# Patient Record
Sex: Male | Born: 1995
Health system: Southern US, Community
[De-identification: ages and names within clinical notes are randomized; demographics above are authoritative.]

## PROBLEM LIST (undated history)

## (undated) DIAGNOSIS — F909 Attention-deficit hyperactivity disorder, unspecified type: Secondary | ICD-10-CM

## (undated) DIAGNOSIS — L700 Acne vulgaris: Secondary | ICD-10-CM

## (undated) HISTORY — PX: HERNIA REPAIR: SHX51

## (undated) HISTORY — DX: Acne vulgaris: L70.0

## (undated) HISTORY — DX: Attention-deficit hyperactivity disorder, unspecified type: F90.9

---

## 2017-04-29 ENCOUNTER — Ambulatory Visit: Payer: Self-pay | Admitting: Infectious Diseases

## 2017-05-11 ENCOUNTER — Ambulatory Visit (INDEPENDENT_AMBULATORY_CARE_PROVIDER_SITE_OTHER): Payer: BLUE CROSS/BLUE SHIELD | Admitting: Infectious Diseases

## 2017-05-11 ENCOUNTER — Encounter: Payer: Self-pay | Admitting: Infectious Diseases

## 2017-05-11 VITALS — BP 112/75 | HR 70 | Temp 98.2°F | Ht 74.0 in | Wt 184.0 lb

## 2017-05-11 DIAGNOSIS — L700 Acne vulgaris: Secondary | ICD-10-CM | POA: Diagnosis not present

## 2017-05-11 DIAGNOSIS — L0390 Cellulitis, unspecified: Secondary | ICD-10-CM

## 2017-05-11 DIAGNOSIS — Z113 Encounter for screening for infections with a predominantly sexual mode of transmission: Secondary | ICD-10-CM

## 2017-05-11 DIAGNOSIS — B9562 Methicillin resistant Staphylococcus aureus infection as the cause of diseases classified elsewhere: Secondary | ICD-10-CM | POA: Diagnosis not present

## 2017-05-11 DIAGNOSIS — F909 Attention-deficit hyperactivity disorder, unspecified type: Secondary | ICD-10-CM

## 2017-05-11 MED ORDER — SULFAMETHOXAZOLE-TRIMETHOPRIM 800-160 MG PO TABS: 1.0000 | ORAL_TABLET | Freq: Two times a day (BID) | ORAL | 1 refills | Status: AC

## 2017-05-11 NOTE — Progress Notes (Signed)
   Subjective:    Patient ID: Tommy Austin, male    DOB: 17-Jul-1996, 21 y.o.   MRN: [id number]  HPI 21 yo M with hx of recurrent boils. He had I & D of area on his stomach June 2018 (MRSA) and a prev lesion on his waist line 11-2016 (MRSA). He also has a hx of acne, having prev been on accutane.  Has been given doxy. Had no trouble taking rx.  Is currently on doxy for last 10 days. Last boil was 2 weeks ago on R thigh. Is healing. Has ointment that he uses as well, honey based band aids.  Has used nasal anbx daily for since February.  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  Uses Tide detergent.  Works, wears clothes supplied by work.   The past medical history, family history and social history were reviewed/updated in EPIC  Review of Systems  Constitutional: Negative for appetite change, chills, fever and unexpected weight change.  Respiratory: Negative for cough and shortness of breath.   Gastrointestinal: Negative for constipation and diarrhea.  Genitourinary: Negative for difficulty urinating.  Skin: Positive for rash and wound.  Hematological: Negative for adenopathy.   No hx of childhood infections. Had ear infections as infant. No problem with teeth coming in.     Objective:   Physical Exam  Constitutional: He is oriented to person, place, and time. He appears well-developed and well-nourished.  HENT:  Mouth/Throat: No oropharyngeal exudate.  Eyes: Pupils are equal, round, and reactive to light. EOM are normal.  Neck: Normal range of motion. Neck supple.  Cardiovascular: Normal rate, regular rhythm and normal heart sounds.   Pulmonary/Chest: Effort normal and breath sounds normal.  Abdominal: Soft. Bowel sounds are normal. There is no tenderness. There is no rebound.  Musculoskeletal: Normal range of motion.  Lymphadenopathy:    He has no cervical adenopathy.       Right: No epitrochlear adenopathy present.       Left: No epitrochlear  adenopathy present.  Neurological: He is alert and oriented to person, place, and time.  Skin:     Psychiatric: He has a normal mood and affect.          Assessment & Plan:

## 2017-05-11 NOTE — Addendum Note (Signed)
Addended by: Keilyn Haggard C on: 05/11/2017 09:38 AM   Modules accepted: Orders

## 2017-05-11 NOTE — Assessment & Plan Note (Signed)
He appears to be doing everything correctly.  Will change his mupirocin to bid for 5 days of each month Wash clothes in hot water, do not wear more than daily.  Change anbx to bactrim for 30 days hibiclens washes weekly.  Use antibacterial soap (lever 2000 ect).  rtc in 6 weeks.

## 2017-05-12 LAB — HIV ANTIBODY (ROUTINE TESTING W REFLEX): HIV 1&2 Ab, 4th Generation: NONREACTIVE

## 2017-06-03 ENCOUNTER — Emergency Department (HOSPITAL_COMMUNITY)
Admission: EM | Admit: 2017-06-03 | Discharge: 2017-06-03 | Disposition: A | Discharge status: Home / Self Care | Payer: BLUE CROSS/BLUE SHIELD | Attending: Emergency Medicine | Admitting: Emergency Medicine

## 2017-06-03 ENCOUNTER — Emergency Department (HOSPITAL_COMMUNITY): Payer: BLUE CROSS/BLUE SHIELD

## 2017-06-03 ENCOUNTER — Encounter (HOSPITAL_COMMUNITY): Payer: Self-pay | Admitting: Emergency Medicine

## 2017-06-03 DIAGNOSIS — M791 Myalgia: Secondary | ICD-10-CM | POA: Diagnosis not present

## 2017-06-03 DIAGNOSIS — T148XXA Other injury of unspecified body region, initial encounter: Secondary | ICD-10-CM

## 2017-06-03 DIAGNOSIS — F909 Attention-deficit hyperactivity disorder, unspecified type: Secondary | ICD-10-CM | POA: Diagnosis not present

## 2017-06-03 DIAGNOSIS — Z79899 Other long term (current) drug therapy: Secondary | ICD-10-CM | POA: Diagnosis not present

## 2017-06-03 DIAGNOSIS — S82222A Displaced transverse fracture of shaft of left tibia, initial encounter for closed fracture: Secondary | ICD-10-CM | POA: Insufficient documentation

## 2017-06-03 DIAGNOSIS — Y998 Other external cause status: Secondary | ICD-10-CM | POA: Diagnosis not present

## 2017-06-03 DIAGNOSIS — S82422A Displaced transverse fracture of shaft of left fibula, initial encounter for closed fracture: Secondary | ICD-10-CM

## 2017-06-03 DIAGNOSIS — R911 Solitary pulmonary nodule: Secondary | ICD-10-CM | POA: Diagnosis not present

## 2017-06-03 DIAGNOSIS — Y92410 Unspecified street and highway as the place of occurrence of the external cause: Secondary | ICD-10-CM | POA: Diagnosis not present

## 2017-06-03 DIAGNOSIS — Y9389 Activity, other specified: Secondary | ICD-10-CM | POA: Diagnosis not present

## 2017-06-03 DIAGNOSIS — S8252XA Displaced fracture of medial malleolus of left tibia, initial encounter for closed fracture: Secondary | ICD-10-CM | POA: Diagnosis not present

## 2017-06-03 DIAGNOSIS — Z8781 Personal history of (healed) traumatic fracture: Secondary | ICD-10-CM

## 2017-06-03 DIAGNOSIS — R55 Syncope and collapse: Secondary | ICD-10-CM | POA: Insufficient documentation

## 2017-06-03 DIAGNOSIS — S60412A Abrasion of right middle finger, initial encounter: Secondary | ICD-10-CM | POA: Diagnosis not present

## 2017-06-03 DIAGNOSIS — S99912A Unspecified injury of left ankle, initial encounter: Secondary | ICD-10-CM | POA: Diagnosis present

## 2017-06-03 DIAGNOSIS — M7918 Myalgia, other site: Secondary | ICD-10-CM

## 2017-06-03 LAB — BASIC METABOLIC PANEL
Anion gap: 6 (ref 5–15)
BUN: 9 mg/dL (ref 6–20)
CALCIUM: 8.9 mg/dL (ref 8.9–10.3)
CHLORIDE: 103 mmol/L (ref 101–111)
CO2: 26 mmol/L (ref 22–32)
CREATININE: 0.92 mg/dL (ref 0.61–1.24)
GFR calc non Af Amer: >60 mL/min (ref >60)
GLUCOSE: 108 mg/dL — AB (ref 65–99)
Potassium: 3.9 mmol/L (ref 3.5–5.1)
Sodium: 135 mmol/L (ref 135–145)

## 2017-06-03 LAB — CBC
HEMATOCRIT: 42.8 % (ref 39.0–52.0)
HEMOGLOBIN: 14.6 g/dL (ref 13.0–17.0)
MCH: 30.5 pg (ref 26.0–34.0)
MCHC: 34.1 g/dL (ref 30.0–36.0)
MCV: 89.5 fL (ref 78.0–100.0)
Platelets: 229 10*3/uL (ref 150–400)
RBC: 4.78 MIL/uL (ref 4.22–5.81)
RDW: 12.9 % (ref 11.5–15.5)
WBC: 17.5 10*3/uL — ABNORMAL HIGH (ref 4.0–10.5)

## 2017-06-03 MED ORDER — IOPAMIDOL (ISOVUE-300) INJECTION 61%
INTRAVENOUS | Status: AC
  Administered 2017-06-03: 75 mL
  Filled 2017-06-03: qty 75

## 2017-06-03 MED ORDER — OXYCODONE-ACETAMINOPHEN 5-325 MG PO TABS: 1.0000 | ORAL_TABLET | ORAL | 0 refills | Status: DC | PRN

## 2017-06-03 MED ORDER — PROPOFOL 10 MG/ML IV BOLUS
INTRAVENOUS | Status: AC | PRN
  Administered 2017-06-03: 25 mg via INTRAVENOUS
  Administered 2017-06-03: 70 mg via INTRAVENOUS

## 2017-06-03 MED ORDER — METHOCARBAMOL 500 MG PO TABS
750.0000 mg | ORAL_TABLET | Freq: Once | ORAL | Status: AC
  Administered 2017-06-03: 750 mg via ORAL
  Filled 2017-06-03: qty 2

## 2017-06-03 MED ORDER — MORPHINE SULFATE (PF) 4 MG/ML IV SOLN
4.0000 mg | Freq: Once | INTRAVENOUS | Status: AC
  Administered 2017-06-03: 4 mg via INTRAVENOUS
  Filled 2017-06-03: qty 1

## 2017-06-03 MED ORDER — IBUPROFEN 600 MG PO TABS: 600.0000 mg | ORAL_TABLET | Freq: Four times a day (QID) | ORAL | 0 refills | Status: DC | PRN

## 2017-06-03 MED ORDER — PROPOFOL 10 MG/ML IV BOLUS
1.0000 mg/kg | Freq: Once | INTRAVENOUS | Status: AC
  Administered 2017-06-03: 86.2 mg via INTRAVENOUS
  Filled 2017-06-03: qty 20

## 2017-06-03 MED ORDER — METHOCARBAMOL 500 MG PO TABS: 500.0000 mg | ORAL_TABLET | Freq: Two times a day (BID) | ORAL | 0 refills | Status: DC

## 2017-06-03 MED ORDER — SODIUM CHLORIDE 0.9 % IV BOLUS (SEPSIS)
1000.0000 mL | Freq: Once | INTRAVENOUS | Status: AC
  Administered 2017-06-03: 1000 mL via INTRAVENOUS
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
## 2017-06-03 NOTE — ED Notes (Signed)
Pt still not back from CT waiting to draw labs

## 2017-06-03 NOTE — ED Notes (Signed)
Patient transported to C/xray
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
## 2017-06-03 NOTE — Progress Notes (Signed)
Orthopedic Tech Progress Note Patient Details:  Donata Dufficholas Gauer 1996/06/29 [id number]  Ortho Devices Type of Ortho Device: Ace wrap, Crutches, Post (short leg) splint, Stirrup splint Ortho Device/Splint Location: LLE Ortho Device/Splint Interventions: Ordered, Application, Adjustment   Jennye MoccasinHughes, Winston Sobczyk Craig 06/03/2017, 6:12 PM

## 2017-06-24 ENCOUNTER — Encounter: Payer: Self-pay | Admitting: Infectious Diseases

## 2017-06-24 ENCOUNTER — Ambulatory Visit (INDEPENDENT_AMBULATORY_CARE_PROVIDER_SITE_OTHER): Payer: BLUE CROSS/BLUE SHIELD | Admitting: Infectious Diseases

## 2017-06-24 VITALS — BP 124/76 | HR 94 | Temp 98.2°F | Wt 193.0 lb

## 2017-06-24 DIAGNOSIS — B9562 Methicillin resistant Staphylococcus aureus infection as the cause of diseases classified elsewhere: Secondary | ICD-10-CM

## 2017-06-24 DIAGNOSIS — Z23 Encounter for immunization: Secondary | ICD-10-CM

## 2017-06-24 DIAGNOSIS — L0390 Cellulitis, unspecified: Secondary | ICD-10-CM

## 2017-06-24 NOTE — Assessment & Plan Note (Signed)
He is doing well Will continue his regiemen of monthly mupirocin.   With regards to his surgery- I advised him to take a hibiclens bath tomorrow pm, to ask his surgeon to use vancomycin peri-operatively.  Will cc: his surgeon as well.   He will rtc in prn.
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
## 2017-06-24 NOTE — Addendum Note (Signed)
Addended by: Laurell Josephs on: 06/24/2017 12:16 PM   Modules accepted: Orders

## 2017-11-20 DIAGNOSIS — F4323 Adjustment disorder with mixed anxiety and depressed mood: Secondary | ICD-10-CM | POA: Diagnosis not present

## 2017-12-21 DIAGNOSIS — R112 Nausea with vomiting, unspecified: Secondary | ICD-10-CM | POA: Diagnosis not present

## 2018-01-05 DIAGNOSIS — F4323 Adjustment disorder with mixed anxiety and depressed mood: Secondary | ICD-10-CM | POA: Diagnosis not present

## 2018-03-16 DIAGNOSIS — Z8719 Personal history of other diseases of the digestive system: Secondary | ICD-10-CM | POA: Diagnosis not present

## 2018-03-16 DIAGNOSIS — Z9889 Other specified postprocedural states: Secondary | ICD-10-CM | POA: Diagnosis not present

## 2018-03-16 DIAGNOSIS — S81802A Unspecified open wound, left lower leg, initial encounter: Secondary | ICD-10-CM | POA: Diagnosis not present

## 2018-03-16 DIAGNOSIS — R1031 Right lower quadrant pain: Secondary | ICD-10-CM | POA: Diagnosis not present

## 2019-08-15 DIAGNOSIS — Z23 Encounter for immunization: Secondary | ICD-10-CM | POA: Diagnosis not present

## 2019-08-15 DIAGNOSIS — F909 Attention-deficit hyperactivity disorder, unspecified type: Secondary | ICD-10-CM | POA: Diagnosis not present

## 2019-08-15 DIAGNOSIS — Z8659 Personal history of other mental and behavioral disorders: Secondary | ICD-10-CM | POA: Diagnosis not present

## 2019-09-15 ENCOUNTER — Encounter (HOSPITAL_COMMUNITY): Payer: Self-pay | Admitting: Emergency Medicine

## 2019-09-15 ENCOUNTER — Other Ambulatory Visit: Payer: Self-pay

## 2019-09-15 ENCOUNTER — Ambulatory Visit (HOSPITAL_COMMUNITY)
Admission: EM | Admit: 2019-09-15 | Discharge: 2019-09-15 | Disposition: A | Discharge status: Home / Self Care | Payer: BC Managed Care – PPO | Attending: Family Medicine | Admitting: Family Medicine

## 2019-09-15 DIAGNOSIS — G43119 Migraine with aura, intractable, without status migrainosus: Secondary | ICD-10-CM | POA: Diagnosis not present

## 2019-09-15 DIAGNOSIS — R112 Nausea with vomiting, unspecified: Secondary | ICD-10-CM | POA: Diagnosis not present

## 2019-09-15 MED ORDER — METOCLOPRAMIDE HCL 5 MG/ML IJ SOLN
INTRAMUSCULAR | Status: AC
  Filled 2019-09-15: qty 2

## 2019-09-15 MED ORDER — METOCLOPRAMIDE HCL 5 MG/ML IJ SOLN
5.0000 mg | Freq: Once | INTRAMUSCULAR | Status: AC
Start: 2019-09-15 — End: 2019-09-15
  Administered 2019-09-15: 17:00:00 5 mg via INTRAMUSCULAR

## 2019-09-15 MED ORDER — DEXAMETHASONE SODIUM PHOSPHATE 10 MG/ML IJ SOLN
10.0000 mg | Freq: Once | INTRAMUSCULAR | Status: AC
Start: 2019-09-15 — End: 2019-09-15
  Administered 2019-09-15: 17:00:00 10 mg via INTRAMUSCULAR

## 2019-09-15 MED ORDER — ONDANSETRON 4 MG PO TBDP
ORAL_TABLET | ORAL | Status: AC
  Filled 2019-09-15: qty 1

## 2019-09-15 MED ORDER — ONDANSETRON 4 MG PO TBDP
4.0000 mg | ORAL_TABLET | Freq: Once | ORAL | Status: AC
  Administered 2019-09-15: 17:00:00 4 mg via ORAL

## 2019-09-15 MED ORDER — KETOROLAC TROMETHAMINE 60 MG/2ML IM SOLN
INTRAMUSCULAR | Status: AC
  Filled 2019-09-15: qty 2

## 2019-09-15 MED ORDER — DEXAMETHASONE SODIUM PHOSPHATE 10 MG/ML IJ SOLN
INTRAMUSCULAR | Status: AC
  Filled 2019-09-15: qty 1

## 2019-09-15 MED ORDER — KETOROLAC TROMETHAMINE 60 MG/2ML IM SOLN
60.0000 mg | Freq: Once | INTRAMUSCULAR | Status: AC
Start: 2019-09-15 — End: 2019-09-15
  Administered 2019-09-15: 17:00:00 60 mg via INTRAMUSCULAR

## 2019-09-15 MED ORDER — ONDANSETRON 4 MG PO TBDP: 4.0000 mg | ORAL_TABLET | Freq: Three times a day (TID) | ORAL | 0 refills | Status: AC | PRN

## 2019-09-15 NOTE — Discharge Instructions (Signed)
Meds ordered this encounter  Medications   ketorolac (TORADOL) injection 60 mg   metoCLOPramide (REGLAN) injection 5 mg   dexamethasone (DECADRON) injection 10 mg   ondansetron (ZOFRAN-ODT) disintegrating tablet 4 mg

## 2019-09-15 NOTE — ED Triage Notes (Signed)
Pt here for intermittent headaches onset 4 days associated w/n/v  Denies fevers, SOB, dyspnea  Hx of Migraines  A&O x4... NAD.Marland Kitchen. ambulatory

## 2019-09-15 NOTE — ED Provider Notes (Signed)
[place]   [id number] 09/15/19 Arrival Time: 0865  ASSESSMENT & PLAN:  1. Intractable migraine with aura without status migrainosus   2. Intractable vomiting with nausea, unspecified vomiting type      Meds ordered this encounter  Medications  . ketorolac (TORADOL) injection 60 mg  . metoCLOPramide (REGLAN) injection 5 mg  . dexamethasone (DECADRON) injection 10 mg  . ondansetron (ZOFRAN-ODT) disintegrating tablet 4 mg  . ondansetron (ZOFRAN-ODT) 4 MG disintegrating tablet    Sig: Take 1 tablet (4 mg total) by mouth every 8 (eight) hours as needed for nausea or vomiting.    Dispense:  15 tablet    Refill:  0   No signs of dehydration requiring IVF. Normal neurological exam. Afebrile without nuchal rigidity. Discussed. Current presentation and symptoms are consistent with prior migraines and are not consistent with SAH, ICH, meningitis, or temporal arteritis. Discussed.  Reports that he has an appt next week to see a neurologist. Comfortable with home observation.  Recommend: Follow-up Information    [facility].   Specialty: Emergency Medicine Why: If symptoms worsen in any way. Contact information: [address] [id number] [place] [phone]          Reviewed expectations re: course of current medical issues. Questions answered. Outlined signs and symptoms indicating need for more acute intervention. Patient verbalized understanding. After Visit Summary given.   SUBJECTIVE: History from: Patient. Patient is able to give a clear and coherent history.  Tommy Austin is a 23 y.o. male who presents with complaint of a migraine headache. Onset gradual, approx 4 d ago. Location: genrealized but started occiipitally. History of headaches: yes, with similar symptoms; usually not this prolonged though. Precipitating factors include: none which have been determined. Not the worst  headache of his life. Associated symptoms: Preceding aura: yes, spots. Nausea/vomiting: fairly persistent. Vision changes: no. Increased sensitivity to light and to noises: mild. Fever: no. No URI symptoms. Sinus pressure/congestion: no. Extremity weakness: no. Home treatment has included Tylenol with little improvement. Current headache has limited normal daily activities. Called out of work the past two days. Requests work note. Denies depression, dizziness, loss of balance, muscle weakness, numbness of extremities and speech difficulties. No head injury reported. Ambulatory without difficulty. No recent travel.  ROS: As per HPI. All other systems negative.    OBJECTIVE:  Vitals:   09/15/19 1658  BP: 125/69  Pulse: 60  Resp: 18  Temp: 98.3 F (36.8 C)  TempSrc: Oral  SpO2: 100%    General appearance: alert; NAD but appears fatigued HENT: normocephalic; atraumatic Eyes: PERRLA; EOMI; conjunctivae normal Neck: supple with FROM Lungs: speaks full sentences without difficulty; unlabored respirations Heart: regular rate Extremities: no edema; symmetrical with no gross deformities Skin: warm and dry Neurologic: alert; speech is fluent and clear without dysarthria or aphasia; CN 2-12 grossly intact; no facial droop; normal gait; normal symmetric reflexes; normal extremity strength and sensation throughout; bilateral upper and lower extremity sensation is grossly intact with 5/5 symmetric strength Psychological: alert and cooperative; normal mood and affect   No Known Allergies  Past Medical History:  Diagnosis Date  . ADHD   . Cystic acne    Social History   Socioeconomic History  . Marital status: Single    Spouse name: Not on file  . Number of children: Not on file  . Years of education: Not on file  . Highest education level: Not on file  Occupational History  .  Not on file  Tobacco Use  . Smoking status: Never Smoker  . Smokeless tobacco: Never Used    Substance and Sexual Activity  . Alcohol use: No  . Drug use: No  . Sexual activity: Yes    Partners: Female  Other Topics Concern  . Not on file  Social History Narrative  . Not on file   Social Determinants of Health   Financial Resource Strain:   . Difficulty of Paying Living Expenses: Not on file  Food Insecurity:   . Worried About Programme researcher, broadcasting/film/video in the Last Year: Not on file  . Ran Out of Food in the Last Year: Not on file  Transportation Needs:   . Lack of Transportation (Medical): Not on file  . Lack of Transportation (Non-Medical): Not on file  Physical Activity:   . Days of Exercise per Week: Not on file  . Minutes of Exercise per Session: Not on file  Stress:   . Feeling of Stress : Not on file  Social Connections:   . Frequency of Communication with Friends and Family: Not on file  . Frequency of Social Gatherings with Friends and Family: Not on file  . Attends Religious Services: Not on file  . Active Member of Clubs or Organizations: Not on file  . Attends Banker Meetings: Not on file  . Marital Status: Not on file  Intimate Partner Violence:   . Fear of Current or Ex-Partner: Not on file  . Emotionally Abused: Not on file  . Physically Abused: Not on file  . Sexually Abused: Not on file   Family History  Problem Relation Age of Onset  . Acne Mother    Past Surgical History:  Procedure Laterality Date  . HERNIA REPAIR       Mardella Layman, MD 09/15/19 [phone]

## 2019-09-19 DIAGNOSIS — G43109 Migraine with aura, not intractable, without status migrainosus: Secondary | ICD-10-CM | POA: Diagnosis not present

## 2019-09-21 ENCOUNTER — Encounter (HOSPITAL_COMMUNITY): Payer: Self-pay | Admitting: Emergency Medicine

## 2019-09-21 ENCOUNTER — Emergency Department (HOSPITAL_COMMUNITY)
Admission: EM | Admit: 2019-09-21 | Discharge: 2019-09-21 | Disposition: A | Discharge status: Home / Self Care | Payer: BC Managed Care – PPO | Source: Home / Self Care | Attending: Emergency Medicine | Admitting: Emergency Medicine

## 2019-09-21 ENCOUNTER — Other Ambulatory Visit: Payer: Self-pay

## 2019-09-21 DIAGNOSIS — R519 Headache, unspecified: Secondary | ICD-10-CM

## 2019-09-21 DIAGNOSIS — Z79899 Other long term (current) drug therapy: Secondary | ICD-10-CM | POA: Insufficient documentation

## 2019-09-21 DIAGNOSIS — G43809 Other migraine, not intractable, without status migrainosus: Secondary | ICD-10-CM

## 2019-09-21 DIAGNOSIS — R112 Nausea with vomiting, unspecified: Secondary | ICD-10-CM | POA: Insufficient documentation

## 2019-09-21 MED ORDER — PROCHLORPERAZINE EDISYLATE 10 MG/2ML IJ SOLN
10.0000 mg | Freq: Once | INTRAMUSCULAR | Status: AC
  Administered 2019-09-21: 10:00:00 10 mg via INTRAVENOUS
  Filled 2019-09-21: qty 2

## 2019-09-21 MED ORDER — SODIUM CHLORIDE 0.9 % IV BOLUS
1000.0000 mL | Freq: Once | INTRAVENOUS | Status: AC
  Administered 2019-09-21: 10:00:00 1000 mL via INTRAVENOUS

## 2019-09-21 MED ORDER — DIPHENHYDRAMINE HCL 50 MG/ML IJ SOLN
25.0000 mg | Freq: Once | INTRAMUSCULAR | Status: AC
  Administered 2019-09-21: 10:00:00 25 mg via INTRAVENOUS
  Filled 2019-09-21: qty 1

## 2019-09-21 MED ORDER — KETOROLAC TROMETHAMINE 30 MG/ML IJ SOLN
30.0000 mg | Freq: Once | INTRAMUSCULAR | Status: AC
  Administered 2019-09-21: 10:00:00 30 mg via INTRAVENOUS
  Filled 2019-09-21: qty 1

## 2019-09-21 MED ORDER — DEXAMETHASONE SODIUM PHOSPHATE 10 MG/ML IJ SOLN
10.0000 mg | Freq: Once | INTRAMUSCULAR | Status: AC
  Administered 2019-09-21: 10:00:00 10 mg via INTRAVENOUS
  Filled 2019-09-21: qty 1

## 2019-09-21 MED ORDER — PROCHLORPERAZINE MALEATE 10 MG PO TABS: 10.0000 mg | ORAL_TABLET | Freq: Two times a day (BID) | ORAL | 0 refills | Status: AC | PRN

## 2019-09-21 NOTE — ED Triage Notes (Signed)
Pt reports this is day 10 of migraine and having n/v. Reports migraine meds was prescribed recently aren't helping.

## 2019-09-21 NOTE — Discharge Instructions (Signed)
Your history and exam today are consistent with a bad migraine headache.  After our shared decision made conversation, we agreed to provide a headache cocktail through your IV and see if this makes feel better.  Given your complete resolution of headache and feeling much better, we feel you are safe for discharge home and not have to do imaging, blood work, or lumbar puncture as we discussed.  Please follow-up with your primary doctor and stay hydrated.  Please use the nausea medicine/headache medicine to help.  If any symptoms change or worsen, please return to the nearest emergency department.

## 2019-09-21 NOTE — ED Notes (Signed)
Patient verbalized understanding of discharge instructions and questions answered.

## 2019-09-21 NOTE — ED Provider Notes (Signed)
[place] DEPT Provider Note   CSN: [id number] Arrival date & time: 09/21/19  4174     History Chief Complaint  Patient presents with  . Migraine  . Emesis    Tommy Austin is a 23 y.o. male.  The history is provided by the patient and medical records.  Migraine This is a recurrent problem. The current episode started more than 1 week ago. The problem occurs constantly. The problem has not changed since onset.Associated symptoms include headaches. Pertinent negatives include no chest pain, no abdominal pain and no shortness of breath. Nothing aggravates the symptoms. Nothing relieves the symptoms. He has tried nothing for the symptoms. The treatment provided no relief.  Emesis Associated symptoms: headaches   Associated symptoms: no abdominal pain, no chills, no diarrhea and no fever        Past Medical History:  Diagnosis Date  . ADHD   . Cystic acne     Patient Active Problem List   Diagnosis Date Noted  . ADHD 05/11/2017  . Cystic acne 05/11/2017  . MRSA cellulitis 05/11/2017    Past Surgical History:  Procedure Laterality Date  . HERNIA REPAIR         Family History  Problem Relation Age of Onset  . Acne Mother     Social History   Tobacco Use  . Smoking status: Never Smoker  . Smokeless tobacco: Never Used  Substance Use Topics  . Alcohol use: No  . Drug use: No    Home Medications Prior to Admission medications   Medication Sig Start Date End Date Taking? Authorizing Provider  cholecalciferol (VITAMIN D) 1000 units tablet Take 1,000 Units by mouth daily.    [provider]  ibuprofen (ADVIL,MOTRIN) 600 MG tablet Take 1 tablet (600 mg total) by mouth every 6 (six) hours as needed. Patient not taking: Reported on 06/24/2017 06/03/17   Shary Decamp, PA-C  methocarbamol (ROBAXIN) 500 MG tablet Take 1 tablet (500 mg total) by mouth 2 (two) times daily. Patient not taking: Reported on 06/24/2017 06/03/17    Shary Decamp, PA-C  Methylphenidate HCl ER 54 MG TB24 Take 72 mg by mouth.    [provider]  mupirocin ointment (BACTROBAN) 2 % APPLY TO AFFECTED AREA daily for 7 days every month 03/18/17   [provider]  ondansetron (ZOFRAN-ODT) 4 MG disintegrating tablet Take 1 tablet (4 mg total) by mouth every 8 (eight) hours as needed for nausea or vomiting. 09/15/19   Vanessa Kick, MD  oxyCODONE-acetaminophen (PERCOCET/ROXICET) 5-325 MG tablet Take 1 tablet by mouth every 4 (four) hours as needed for severe pain. Patient not taking: Reported on 06/24/2017 06/03/17   Shary Decamp, PA-C  PARoxetine (PAXIL) 10 MG tablet take 1 tablet by mouth daily 04/27/17   [provider]  Probiotic Product (ALIGN PO) Take 1 capsule by mouth daily.    [provider]  vitamin B-12 (CYANOCOBALAMIN) 1000 MCG tablet Take 1,000 mcg by mouth daily.    [provider]    Allergies    Patient has no known allergies.  Review of Systems   Review of Systems  Constitutional: Negative for chills, diaphoresis, fatigue and fever.  HENT: Negative for congestion.   Eyes: Positive for photophobia. Negative for visual disturbance.  Respiratory: Negative for chest tightness, shortness of breath and wheezing.   Cardiovascular: Negative for chest pain.  Gastrointestinal: Positive for nausea and vomiting. Negative for abdominal pain, constipation and diarrhea.  Genitourinary: Negative for flank pain and  frequency.  Musculoskeletal: Negative for back pain, neck pain and neck stiffness.  Skin: Negative for rash and wound.  Neurological: Positive for headaches. Negative for dizziness and light-headedness.  Psychiatric/Behavioral: Negative for agitation and confusion.    Physical Exam Updated Vital Signs BP 126/78   Pulse (!) 58   Temp 98 F (36.7 C) (Oral)   Resp 13   SpO2 99%   Physical Exam Vitals and nursing note reviewed.  Constitutional:      General: He is not in acute  distress.    Appearance: He is well-developed. He is not ill-appearing, toxic-appearing or diaphoretic.  HENT:     Head: Normocephalic and atraumatic.     Nose: Nose normal. No congestion or rhinorrhea.     Mouth/Throat:     Mouth: Mucous membranes are moist.     Pharynx: No oropharyngeal exudate or posterior oropharyngeal erythema.  Eyes:     Extraocular Movements: Extraocular movements intact.     Conjunctiva/sclera: Conjunctivae normal.     Pupils: Pupils are equal, round, and reactive to light.  Cardiovascular:     Rate and Rhythm: Normal rate and regular rhythm.     Heart sounds: No murmur.  Pulmonary:     Effort: Pulmonary effort is normal. No respiratory distress.     Breath sounds: Normal breath sounds. No wheezing, rhonchi or rales.  Chest:     Chest wall: No tenderness.  Abdominal:     Palpations: Abdomen is soft.     Tenderness: There is no abdominal tenderness. There is no right CVA tenderness, left CVA tenderness, guarding or rebound.  Musculoskeletal:        General: No tenderness.     Cervical back: Neck supple. No tenderness.  Skin:    General: Skin is warm and dry.     Capillary Refill: Capillary refill takes less than 2 seconds.     Findings: No erythema.  Neurological:     General: No focal deficit present.     Mental Status: He is alert.  Psychiatric:        Mood and Affect: Mood normal.     ED Results / Procedures / Treatments   Labs (all labs ordered are listed, but only abnormal results are displayed) Labs Reviewed - No data to display  EKG None  Radiology No results found.  Procedures Procedures (including critical care time)  Medications Ordered in ED Medications  prochlorperazine (COMPAZINE) injection 10 mg (10 mg Intravenous Given 09/21/19 0957)  diphenhydrAMINE (BENADRYL) injection 25 mg (25 mg Intravenous Given 09/21/19 0957)  dexamethasone (DECADRON) injection 10 mg (10 mg Intravenous Given 09/21/19 0956)  ketorolac (TORADOL) 30  MG/ML injection 30 mg (30 mg Intravenous Given 09/21/19 0956)  sodium chloride 0.9 % bolus 1,000 mL (0 mLs Intravenous Stopped 09/21/19 1149)    ED Course  I have reviewed the triage vital signs and the nursing notes.  Pertinent labs & imaging results that were available during my care of the patient were reviewed by me and considered in my medical decision making (see chart for details).    MDM Rules/Calculators/A&P                      Tommy Austin is a 23 y.o. male with a past medical history significant for ADHD and prior migraines who presents with headache.  He reports that for the last week and a half he has had significant headaches consistent with prior migraines.  He reports nausea, vomiting, auras,  and severe headache.  He is sitting in the dark with nausea and vomiting when I examined him.  He denies recent fevers, chills, neck pain or neck stiffness.  He denies any trauma.  He reports it feels similar but worse than prior.  No neurologic complaints otherwise.  No new medication use.  He denies any chest pain, shortness of breath, palpitations, constipation, diarrhea.  On exam, patient moving all extremities.  Patient is actively vomiting.  Patient has normal sensation in all extremities.  Lungs clear chest nontender abdomen nontender.  Normal neck range of motion.  Pupils symmetric.  Neck is nontender.  Clinically suspect this is a migraine type headache.  We discussed giving a headache cocktail initially and if symptoms not improve, we could consider imaging or other work-up.  Patient is agreeable with this plan.  He will be given a cocktail and reassess.  Anticipate discharge if headache improves.     2:08 PM Patient reports headache is completely resolved.  It is now a 0 out of 10.  Patient feels he is ready go home.  Patient will given prescription for Compazine to help with other nausea and headache.  He was instructed to use Benadryl if he gets some of the side effects.   Patient will stay hydrated and rest.  Patient agreed with plan of care and not pursuing lumbar puncture, imaging, or labs.  Patient had no depressions or concerns and was discharged in good condition with resolution of headache.    Final Clinical Impression(s) / ED Diagnoses Final diagnoses:  Other migraine without status migrainosus, not intractable  Bad headache    Rx / DC Orders ED Discharge Orders         Ordered    prochlorperazine (COMPAZINE) 10 MG tablet  2 times daily PRN     09/21/19 1407          Clinical Impression: 1. Other migraine without status migrainosus, not intractable   2. Bad headache     Disposition: Discharge  Condition: Good  I have discussed the results, Dx and Tx plan with the pt(& family if present). He/she/they expressed understanding and agree(s) with the plan. Discharge instructions discussed at great length. Strict return precautions discussed and pt &/or family have verbalized understanding of the instructions. No further questions at time of discharge.    New Prescriptions   PROCHLORPERAZINE (COMPAZINE) 10 MG TABLET    Take 1 tablet (10 mg total) by mouth 2 (two) times daily as needed for nausea or vomiting.    Follow Up: Renford DillsPolite, Ronald, MD [address] [place] [id number] [phone]     [facility] [address] [id number] [address] [id number] [phone]       Rika Daughdrill, Canary Brimhristopher J, MD 09/21/19 1409

## 2019-09-22 ENCOUNTER — Inpatient Hospital Stay (HOSPITAL_COMMUNITY)
Admission: EM | Admit: 2019-09-22 | Discharge: 2019-10-16 | DRG: 025 | Disposition: A | Discharge status: Home / Self Care | Payer: BC Managed Care – PPO | Attending: Neurological Surgery | Admitting: Neurological Surgery

## 2019-09-22 ENCOUNTER — Emergency Department (HOSPITAL_COMMUNITY): Payer: BC Managed Care – PPO

## 2019-09-22 ENCOUNTER — Encounter (HOSPITAL_COMMUNITY): Payer: Self-pay

## 2019-09-22 ENCOUNTER — Other Ambulatory Visit: Payer: Self-pay

## 2019-09-22 DIAGNOSIS — G0490 Encephalitis and encephalomyelitis, unspecified: Secondary | ICD-10-CM | POA: Diagnosis not present

## 2019-09-22 DIAGNOSIS — B370 Candidal stomatitis: Secondary | ICD-10-CM | POA: Diagnosis present

## 2019-09-22 DIAGNOSIS — G919 Hydrocephalus, unspecified: Secondary | ICD-10-CM | POA: Diagnosis not present

## 2019-09-22 DIAGNOSIS — G930 Cerebral cysts: Secondary | ICD-10-CM | POA: Diagnosis not present

## 2019-09-22 DIAGNOSIS — D496 Neoplasm of unspecified behavior of brain: Secondary | ICD-10-CM | POA: Diagnosis not present

## 2019-09-22 DIAGNOSIS — Q046 Congenital cerebral cysts: Secondary | ICD-10-CM | POA: Diagnosis not present

## 2019-09-22 DIAGNOSIS — G43809 Other migraine, not intractable, without status migrainosus: Secondary | ICD-10-CM | POA: Diagnosis present

## 2019-09-22 DIAGNOSIS — G911 Obstructive hydrocephalus: Secondary | ICD-10-CM | POA: Diagnosis not present

## 2019-09-22 DIAGNOSIS — R55 Syncope and collapse: Secondary | ICD-10-CM | POA: Diagnosis not present

## 2019-09-22 DIAGNOSIS — R519 Headache, unspecified: Secondary | ICD-10-CM

## 2019-09-22 DIAGNOSIS — R112 Nausea with vomiting, unspecified: Secondary | ICD-10-CM | POA: Diagnosis present

## 2019-09-22 DIAGNOSIS — Z20822 Contact with and (suspected) exposure to covid-19: Secondary | ICD-10-CM | POA: Diagnosis present

## 2019-09-22 DIAGNOSIS — R4182 Altered mental status, unspecified: Secondary | ICD-10-CM | POA: Diagnosis not present

## 2019-09-22 DIAGNOSIS — I615 Nontraumatic intracerebral hemorrhage, intraventricular: Secondary | ICD-10-CM | POA: Diagnosis present

## 2019-09-22 DIAGNOSIS — E871 Hypo-osmolality and hyponatremia: Secondary | ICD-10-CM | POA: Diagnosis present

## 2019-09-22 DIAGNOSIS — I619 Nontraumatic intracerebral hemorrhage, unspecified: Secondary | ICD-10-CM | POA: Diagnosis not present

## 2019-09-22 DIAGNOSIS — Z79899 Other long term (current) drug therapy: Secondary | ICD-10-CM | POA: Diagnosis not present

## 2019-09-22 DIAGNOSIS — R110 Nausea: Secondary | ICD-10-CM | POA: Diagnosis not present

## 2019-09-22 DIAGNOSIS — R52 Pain, unspecified: Secondary | ICD-10-CM | POA: Diagnosis not present

## 2019-09-22 DIAGNOSIS — Z9889 Other specified postprocedural states: Secondary | ICD-10-CM | POA: Diagnosis not present

## 2019-09-22 DIAGNOSIS — Z20828 Contact with and (suspected) exposure to other viral communicable diseases: Secondary | ICD-10-CM | POA: Diagnosis not present

## 2019-09-22 LAB — BASIC METABOLIC PANEL
Anion gap: 11 (ref 5–15)
BUN: 12 mg/dL (ref 6–20)
CO2: 25 mmol/L (ref 22–32)
Calcium: 9.1 mg/dL (ref 8.9–10.3)
Chloride: 104 mmol/L (ref 98–111)
Creatinine, Ser: 0.86 mg/dL (ref 0.61–1.24)
GFR calc Af Amer: >60 mL/min (ref >60)
GFR calc non Af Amer: >60 mL/min (ref >60)
Glucose, Bld: 108 mg/dL — ABNORMAL HIGH (ref 70–99)
Potassium: 3.4 mmol/L — ABNORMAL LOW (ref 3.5–5.1)
Sodium: 140 mmol/L (ref 135–145)

## 2019-09-22 LAB — ETHANOL: Alcohol, Ethyl (B): <10 mg/dL (ref <10)

## 2019-09-22 LAB — CBC WITH DIFFERENTIAL/PLATELET
Abs Immature Granulocytes: 0.08 10*3/uL — ABNORMAL HIGH (ref 0.00–0.07)
Basophils Absolute: 0 10*3/uL (ref 0.0–0.1)
Basophils Relative: 0 %
Eosinophils Absolute: 0 10*3/uL (ref 0.0–0.5)
Eosinophils Relative: 0 %
HCT: 43.2 % (ref 39.0–52.0)
Hemoglobin: 14.7 g/dL (ref 13.0–17.0)
Immature Granulocytes: 1 %
Lymphocytes Relative: 11 %
Lymphs Abs: 1.8 10*3/uL (ref 0.7–4.0)
MCH: 30.7 pg (ref 26.0–34.0)
MCHC: 34 g/dL (ref 30.0–36.0)
MCV: 90.2 fL (ref 80.0–100.0)
Monocytes Absolute: 1.3 10*3/uL — ABNORMAL HIGH (ref 0.1–1.0)
Monocytes Relative: 8 %
Neutro Abs: 13.4 10*3/uL — ABNORMAL HIGH (ref 1.7–7.7)
Neutrophils Relative %: 80 %
Platelets: 245 10*3/uL (ref 150–400)
RBC: 4.79 MIL/uL (ref 4.22–5.81)
RDW: 11.7 % (ref 11.5–15.5)
WBC: 16.6 10*3/uL — ABNORMAL HIGH (ref 4.0–10.5)
nRBC: 0 % (ref 0.0–0.2)

## 2019-09-22 LAB — RAPID URINE DRUG SCREEN, HOSP PERFORMED
Amphetamines: NOT DETECTED
Barbiturates: NOT DETECTED
Benzodiazepines: NOT DETECTED
Cocaine: NOT DETECTED
Opiates: NOT DETECTED
Tetrahydrocannabinol: POSITIVE — AB

## 2019-09-22 LAB — RESPIRATORY PANEL BY RT PCR (FLU A&B, COVID)
Influenza A by PCR: NEGATIVE
Influenza B by PCR: NEGATIVE
SARS Coronavirus 2 by RT PCR: NEGATIVE

## 2019-09-22 LAB — CBG MONITORING, ED: Glucose-Capillary: 135 mg/dL — ABNORMAL HIGH (ref 70–99)

## 2019-09-22 MED ORDER — DEXAMETHASONE SODIUM PHOSPHATE 4 MG/ML IJ SOLN
4.0000 mg | Freq: Four times a day (QID) | INTRAMUSCULAR | Status: DC
  Administered 2019-09-23 (×3): 4 mg via INTRAVENOUS
  Filled 2019-09-22 (×3): qty 1

## 2019-09-22 MED ORDER — KETOROLAC TROMETHAMINE 30 MG/ML IJ SOLN
30.0000 mg | Freq: Once | INTRAMUSCULAR | Status: AC
  Administered 2019-09-22: 30 mg via INTRAVENOUS
  Filled 2019-09-22: qty 1

## 2019-09-22 MED ORDER — ACETAMINOPHEN 650 MG RE SUPP: 650.0000 mg | Freq: Four times a day (QID) | RECTAL | Status: DC | PRN

## 2019-09-22 MED ORDER — METOCLOPRAMIDE HCL 5 MG/ML IJ SOLN
10.0000 mg | Freq: Once | INTRAMUSCULAR | Status: AC
  Administered 2019-09-22: 10 mg via INTRAVENOUS
  Filled 2019-09-22: qty 2

## 2019-09-22 MED ORDER — HYDROMORPHONE HCL 1 MG/ML IJ SOLN
0.5000 mg | Freq: Once | INTRAMUSCULAR | Status: AC
  Administered 2019-09-22: 0.5 mg via INTRAVENOUS
  Filled 2019-09-22: qty 1

## 2019-09-22 MED ORDER — ACETAMINOPHEN 325 MG PO TABS
650.0000 mg | ORAL_TABLET | Freq: Four times a day (QID) | ORAL | Status: DC | PRN
  Administered 2019-09-24 – 2019-10-14 (×12): 650 mg via ORAL
  Filled 2019-09-22 (×12): qty 2

## 2019-09-22 MED ORDER — SODIUM CHLORIDE 0.9 % IV BOLUS
1000.0000 mL | Freq: Once | INTRAVENOUS | Status: AC
  Administered 2019-09-22: 1000 mL via INTRAVENOUS

## 2019-09-22 MED ORDER — LEVETIRACETAM IN NACL 500 MG/100ML IV SOLN
500.0000 mg | Freq: Once | INTRAVENOUS | Status: AC
  Administered 2019-09-22: 500 mg via INTRAVENOUS
  Filled 2019-09-22: qty 100

## 2019-09-22 MED ORDER — GADOBUTROL 1 MMOL/ML IV SOLN
7.5000 mL | Freq: Once | INTRAVENOUS | Status: AC | PRN
  Administered 2019-09-22: 7.5 mL via INTRAVENOUS

## 2019-09-22 MED ORDER — DIPHENHYDRAMINE HCL 50 MG/ML IJ SOLN
25.0000 mg | Freq: Once | INTRAMUSCULAR | Status: AC
  Administered 2019-09-22: 25 mg via INTRAVENOUS
  Filled 2019-09-22: qty 1

## 2019-09-22 MED ORDER — ONDANSETRON HCL 4 MG/2ML IJ SOLN
4.0000 mg | Freq: Four times a day (QID) | INTRAMUSCULAR | Status: DC | PRN
  Administered 2019-09-23 – 2019-10-13 (×26): 4 mg via INTRAVENOUS
  Filled 2019-09-22 (×27): qty 2

## 2019-09-22 MED ORDER — HYDROCODONE-ACETAMINOPHEN 5-325 MG PO TABS
1.0000 | ORAL_TABLET | ORAL | Status: DC | PRN
  Administered 2019-09-22: 2 via ORAL
  Administered 2019-09-23 – 2019-09-24 (×4): 1 via ORAL
  Administered 2019-09-25 – 2019-09-27 (×5): 2 via ORAL
  Administered 2019-09-27: 1 via ORAL
  Administered 2019-09-27: 2 via ORAL
  Administered 2019-09-27 – 2019-09-28 (×5): 1 via ORAL
  Administered 2019-09-28: 2 via ORAL
  Administered 2019-09-29 (×3): 1 via ORAL
  Administered 2019-10-02 – 2019-10-04 (×6): 2 via ORAL
  Administered 2019-10-04: 1 via ORAL
  Administered 2019-10-05: 2 via ORAL
  Administered 2019-10-05: 1 via ORAL
  Administered 2019-10-05 (×3): 2 via ORAL
  Administered 2019-10-06: 1 via ORAL
  Administered 2019-10-06: 2 via ORAL
  Administered 2019-10-06: 1 via ORAL
  Administered 2019-10-07 – 2019-10-10 (×9): 2 via ORAL
  Administered 2019-10-11: 1 via ORAL
  Administered 2019-10-11 – 2019-10-12 (×7): 2 via ORAL
  Administered 2019-10-13: 1 via ORAL
  Administered 2019-10-13 – 2019-10-16 (×6): 2 via ORAL
  Filled 2019-09-22 (×2): qty 1
  Filled 2019-09-22 (×10): qty 2
  Filled 2019-09-22: qty 1
  Filled 2019-09-22 (×3): qty 2
  Filled 2019-09-22: qty 1
  Filled 2019-09-22 (×6): qty 2
  Filled 2019-09-22: qty 1
  Filled 2019-09-22: qty 2
  Filled 2019-09-22: qty 1
  Filled 2019-09-22: qty 2
  Filled 2019-09-22 (×2): qty 1
  Filled 2019-09-22 (×6): qty 2
  Filled 2019-09-22: qty 1
  Filled 2019-09-22 (×3): qty 2
  Filled 2019-09-22: qty 1
  Filled 2019-09-22 (×3): qty 2
  Filled 2019-09-22: qty 1
  Filled 2019-09-22 (×2): qty 2
  Filled 2019-09-22: qty 1
  Filled 2019-09-22: qty 2
  Filled 2019-09-22: qty 1
  Filled 2019-09-22: qty 2
  Filled 2019-09-22: qty 1
  Filled 2019-09-22 (×4): qty 2
  Filled 2019-09-22 (×4): qty 1
  Filled 2019-09-22 (×3): qty 2
  Filled 2019-09-22: qty 1
  Filled 2019-09-22: qty 2
  Filled 2019-09-22: qty 1
  Filled 2019-09-22: qty 2

## 2019-09-22 MED ORDER — FENTANYL CITRATE (PF) 100 MCG/2ML IJ SOLN
50.0000 ug | Freq: Once | INTRAMUSCULAR | Status: AC
  Administered 2019-09-22: 50 ug via INTRAVENOUS
  Filled 2019-09-22: qty 2

## 2019-09-22 MED ORDER — ONDANSETRON HCL 4 MG PO TABS
4.0000 mg | ORAL_TABLET | Freq: Four times a day (QID) | ORAL | Status: DC | PRN
  Administered 2019-10-09 – 2019-10-11 (×2): 4 mg via ORAL
  Filled 2019-09-22 (×4): qty 1

## 2019-09-22 MED ORDER — LEVETIRACETAM IN NACL 500 MG/100ML IV SOLN
500.0000 mg | Freq: Two times a day (BID) | INTRAVENOUS | Status: DC
  Administered 2019-09-22 – 2019-09-26 (×8): 500 mg via INTRAVENOUS
  Filled 2019-09-22 (×8): qty 100

## 2019-09-22 NOTE — ED Triage Notes (Addendum)
EMS reports from home, Pt c/o migraines x 3 weeks. Mother states in process of seeing neurologist.Increasing pain and nausea since 2am this morning.  BP 136/74 HR 60 RR 16 Sp02 100 RA CBG 159 Temp 97.9  20 LAC 4mg  Zofran

## 2019-09-22 NOTE — ED Notes (Signed)
Urinal at bedside. Pt has been made aware that MD needs UA sample. 

## 2019-09-22 NOTE — ED Notes (Signed)
Patient transported to MRI 

## 2019-09-22 NOTE — ED Provider Notes (Addendum)
[facility]-EMERGENCY DEPT Provider Note   CSN: [id number] Arrival date & time: 09/22/19  0809     History Chief Complaint  Patient presents with  . Migraine    Alexsis Branscom is a 23 y.o. male.  Frontal headache for 7 days with associated nausea.  This is happened before.  No neuro deficits, stiff neck, facial asymmetry, motor or sensory weakness.  He has tried Imitrex without relief.  Severity is moderate.  1115: Further history from mother.  She reports her son had a tonic-clonic seizure this morning approximately 0700.  He was confused for a short time after this event.  No previous history of seizures.  No alcohol or street drugs in the past 2 weeks.        Past Medical History:  Diagnosis Date  . ADHD   . Cystic acne     Patient Active Problem List   Diagnosis Date Noted  . ADHD 05/11/2017  . Cystic acne 05/11/2017  . MRSA cellulitis 05/11/2017    Past Surgical History:  Procedure Laterality Date  . HERNIA REPAIR         Family History  Problem Relation Age of Onset  . Acne Mother     Social History   Tobacco Use  . Smoking status: Never Smoker  . Smokeless tobacco: Never Used  Substance Use Topics  . Alcohol use: No  . Drug use: No    Home Medications Prior to Admission medications   Medication Sig Start Date End Date Taking? Authorizing Provider  Ibuprofen-Acetaminophen (ADVIL DUAL ACTION) 125-250 MG TABS Take 1 tablet by mouth every 6 (six) hours as needed (pain).   Yes [provider]  ondansetron (ZOFRAN-ODT) 4 MG disintegrating tablet Take 1 tablet (4 mg total) by mouth every 8 (eight) hours as needed for nausea or vomiting. 09/15/19  Yes Mardella Layman, MD  prochlorperazine (COMPAZINE) 10 MG tablet Take 1 tablet (10 mg total) by mouth 2 (two) times daily as needed for nausea or vomiting. 09/21/19  Yes Tegeler, Canary Brim, MD  SUMAtriptan (IMITREX) 100 MG tablet Take 100 mg by mouth every 2 (two) hours as  needed for migraine. Do not exceed 200 mg in 24 hours 09/19/19  Yes [provider]  traMADol (ULTRAM) 50 MG tablet Take 50 mg by mouth every 6 (six) hours as needed for moderate pain.   Yes [provider]  propranolol (INDERAL) 40 MG tablet Take 40 mg by mouth 2 (two) times daily. 09/19/19   [provider]    Allergies    Patient has no known allergies.  Review of Systems   Review of Systems  All other systems reviewed and are negative.   Physical Exam Updated Vital Signs BP 119/69   Pulse 63   Temp 98 F (36.7 C) (Oral)   Resp 18   SpO2 99%   Physical Exam Vitals and nursing note reviewed.  Constitutional:      Appearance: He is well-developed.     Comments: In pain  HENT:     Head: Normocephalic and atraumatic.  Eyes:     Conjunctiva/sclera: Conjunctivae normal.  Cardiovascular:     Rate and Rhythm: Normal rate and regular rhythm.  Pulmonary:     Effort: Pulmonary effort is normal.     Breath sounds: Normal breath sounds.  Abdominal:     General: Bowel sounds are normal.     Palpations: Abdomen is soft.  Musculoskeletal:        General:  Normal range of motion.     Cervical back: Neck supple.  Skin:    General: Skin is warm and dry.  Neurological:     General: No focal deficit present.     Mental Status: He is alert and oriented to person, place, and time.     Comments: Motor, sensory, cerebellar grossly normal.  Psychiatric:        Behavior: Behavior normal.     ED Results / Procedures / Treatments   Labs (all labs ordered are listed, but only abnormal results are displayed) Labs Reviewed  CBC WITH DIFFERENTIAL/PLATELET - Abnormal; Notable for the following components:      Result Value   WBC 16.6 (*)    Neutro Abs 13.4 (*)    Monocytes Absolute 1.3 (*)    Abs Immature Granulocytes 0.08 (*)    All other components within normal limits  BASIC METABOLIC PANEL - Abnormal; Notable for the following components:   Potassium  3.4 (*)    Glucose, Bld 108 (*)    All other components within normal limits  RAPID URINE DRUG SCREEN, HOSP PERFORMED - Abnormal; Notable for the following components:   Tetrahydrocannabinol POSITIVE (*)    All other components within normal limits  CBG MONITORING, ED - Abnormal; Notable for the following components:   Glucose-Capillary 135 (*)    All other components within normal limits  ETHANOL    EKG None  Radiology CT Head Wo Contrast  Result Date: 09/22/2019 CLINICAL DATA:  Frontal headache and nausea for 7 days. EXAM: CT HEAD WITHOUT CONTRAST TECHNIQUE: Contiguous axial images were obtained from the base of the skull through the vertex without intravenous contrast. COMPARISON:  Head CT scan 06/03/2017. FINDINGS: Brain: There is dilatation of the ventricular system which is increased since the prior examination. Distance between the caudate heads on today's study measures 3.5 cm compared to 2.7 cm on the prior examination and the temporal tips are dilated. There is hypoattenuation in gray matter adjacent to the ventricles compatible with of subependymal CSF resorption. 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 There is no midline shift, hemorrhage or acute infarct. Vascular: No hyperdense vessel or unexpected calcification. Skull: Intact.  No focal lesion. Sinuses/Orbits: Negative. Other: None. IMPRESSION: Hydrocephalus appears moderate in degree and is worse than on the prior head CT. 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 Electronically Signed   By: Inge Rise M.D.   On: 09/22/2019 11:59    Procedures Procedures (including critical care time)  Medications Ordered in ED Medications  levETIRAcetam (KEPPRA) IVPB 500 mg/100 mL premix (has no administration in time range)  sodium chloride 0.9 % bolus 1,000 mL (0 mLs Intravenous Stopped 09/22/19 1050)  ketorolac  (TORADOL) 30 MG/ML injection 30 mg (30 mg Intravenous Given 09/22/19 0858)  metoCLOPramide (REGLAN) injection 10 mg (10 mg Intravenous Given 09/22/19 0858)  diphenhydrAMINE (BENADRYL) injection 25 mg (25 mg Intravenous Given 09/22/19 0857)  fentaNYL (SUBLIMAZE) injection 50 mcg (50 mcg Intravenous Given 09/22/19 1158)    ED Course  I have reviewed the triage vital signs and the nursing notes.  Pertinent labs & imaging results that were available during my care of the patient were reviewed by me and considered in my medical decision making (see chart for details).    MDM Rules/Calculators/A&P  Chief complaint frontal headache with longstanding history of same.  IV fluids, IV Toradol, IV Reglan, IV Benadryl.  Recheck at 1115: Mother reports seizure-like activity.  Will obtain head CT, labs, drug screen.  1330: CT head reveals moderate hydrocephalus.  These findings were discussed with the neurologist [Dr Aroor] and the neurosurgeon [Dr Jenkins]. Keppra 500 mg IV.  MRI of brain ordered.  Also discussed with the patient and his mother.  1500:  Disc c Dr Juleen ChinaKohut.  CRITICAL CARE Performed by: Donnetta HutchingBrian Shanigua Gibb Total critical care time: 30 minutes Critical care time was exclusive of separately billable procedures and treating other patients. Critical care was necessary to treat or prevent imminent or life-threatening deterioration. Critical care was time spent personally by me on the following activities: development of treatment plan with patient and/or surrogate as well as nursing, discussions with consultants, evaluation of patient's response to treatment, examination of patient, obtaining history from patient or surrogate, ordering and performing treatments and interventions, ordering and review of laboratory studies, ordering and review of radiographic studies, pulse oximetry and re-evaluation of patient's condition. Final Clinical Impression(s) / ED Diagnoses Final diagnoses:    Intractable headache, unspecified chronicity pattern, unspecified headache type    Rx / DC Orders ED Discharge Orders    None       Donnetta Hutchingook, Wilmoth Rasnic, MD 09/22/19 [id number]    Donnetta Hutchingook, Tavious Griesinger, MD 09/22/19 1132    Donnetta Hutchingook, Maribell Demeo, MD 09/22/19 1335    Donnetta Hutchingook, Hayat Warbington, MD 09/22/19 1431

## 2019-09-22 NOTE — H&P (Signed)
Tommy Austin is an 23 y.o. male.   Chief Complaint: Seizure HPI: Patient presented to the emergency department this morning following a tonic-clonic seizure that was witnessed by his mother around 0700. He has had a frontal headache for 7 days with associated nausea. He has tried Imitrex and ibuprofen without relief. He denies neck pain or stiffness. He denies use of alcohol or illicit drugs within the last two weeks. CT head revealed moderate hydrocephalus. An MRI was performed and a colloid cyst was noted to be obstructing the foramen of Monro with associated hydrocephalus and periventricular edema. Neurosurgery was contacted for further evaluation.  Past Medical History:  Diagnosis Date  . ADHD   . Cystic acne     Past Surgical History:  Procedure Laterality Date  . HERNIA REPAIR      Family History  Problem Relation Age of Onset  . Acne Mother    Social History:  reports that he has never smoked. He has never used smokeless tobacco. He reports that he does not drink alcohol or use drugs.  Allergies: No Known Allergies  (Not in a hospital admission)   Results for orders placed or performed during the hospital encounter of 09/22/19 (from the past 48 hour(s))  CBG monitoring, ED     Status: Abnormal   Collection Time: 09/22/19  8:21 AM  Result Value Ref Range   Glucose-Capillary 135 (H) 70 - 99 mg/dL  CBC with Differential     Status: Abnormal   Collection Time: 09/22/19 11:31 AM  Result Value Ref Range   WBC 16.6 (H) 4.0 - 10.5 K/uL   RBC 4.79 4.22 - 5.81 MIL/uL   Hemoglobin 14.7 13.0 - 17.0 g/dL   HCT 36.6 29.4 - 76.5 %   MCV 90.2 80.0 - 100.0 fL   MCH 30.7 26.0 - 34.0 pg   MCHC 34.0 30.0 - 36.0 g/dL   RDW 46.5 03.5 - 46.5 %   Platelets 245 150 - 400 K/uL   nRBC 0.0 0.0 - 0.2 %   Neutrophils Relative % 80 %   Neutro Abs 13.4 (H) 1.7 - 7.7 K/uL   Lymphocytes Relative 11 %   Lymphs Abs 1.8 0.7 - 4.0 K/uL   Monocytes Relative 8 %   Monocytes Absolute 1.3 (H) 0.1 -  1.0 K/uL   Eosinophils Relative 0 %   Eosinophils Absolute 0.0 0.0 - 0.5 K/uL   Basophils Relative 0 %   Basophils Absolute 0.0 0.0 - 0.1 K/uL   Immature Granulocytes 1 %   Abs Immature Granulocytes 0.08 (H) 0.00 - 0.07 K/uL    Comment: Performed at [facility], [address], [place], [place] [address]  Basic metabolic panel     Status: Abnormal   Collection Time: 09/22/19 11:31 AM  Result Value Ref Range   Sodium 140 135 - 145 mmol/L   Potassium 3.4 (L) 3.5 - 5.1 mmol/L   Chloride 104 98 - 111 mmol/L   CO2 25 22 - 32 mmol/L   Glucose, Bld 108 (H) 70 - 99 mg/dL   BUN 12 6 - 20 mg/dL   Creatinine, Ser 5.17 0.61 - 1.24 mg/dL   Calcium 9.1 8.9 - 00.1 mg/dL   GFR calc non Af Amer >60 >60 mL/min   GFR calc Af Amer >60 >60 mL/min   Anion gap 11 5 - 15    Comment: Performed at [facility], [address], [place], [place] [address]  Ethanol  Status: None   Collection Time: 09/22/19 11:31 AM  Result Value Ref Range   Alcohol, Ethyl (B) <10 <10 mg/dL    Comment: (NOTE) Lowest detectable limit for serum alcohol is 10 mg/dL. For medical purposes only. Performed at [facility], [address] [place], [place] [address]   Urine rapid drug screen (hosp performed)     Status: Abnormal   Collection Time: 09/22/19 11:50 AM  Result Value Ref Range   Opiates NONE DETECTED NONE DETECTED   Cocaine NONE DETECTED NONE DETECTED   Benzodiazepines NONE DETECTED NONE DETECTED   Amphetamines NONE DETECTED NONE DETECTED   Tetrahydrocannabinol POSITIVE (A) NONE DETECTED   Barbiturates NONE DETECTED NONE DETECTED    Comment: (NOTE) DRUG SCREEN FOR MEDICAL PURPOSES ONLY.  IF CONFIRMATION IS NEEDED FOR ANY PURPOSE, NOTIFY LAB WITHIN 5 DAYS. LOWEST DETECTABLE LIMITS FOR URINE DRUG SCREEN Drug Class                     Cutoff (ng/mL) Amphetamine and metabolites    1000 Barbiturate and metabolites    200 Benzodiazepine                  200 Tricyclics and metabolites     300 Opiates and metabolites        300 Cocaine and metabolites        300 THC                            50 Performed at [facility], [address] [place], [place] [address]    CT Head Wo Contrast  Result Date: 09/22/2019 CLINICAL DATA:  Frontal headache and nausea for 7 days. EXAM: CT HEAD WITHOUT CONTRAST TECHNIQUE: Contiguous axial images were obtained from the base of the skull through the vertex without intravenous contrast. COMPARISON:  Head CT scan 06/03/2017. FINDINGS: Brain: There is dilatation of the ventricular system which is increased since the prior examination. Distance between the caudate heads on today's study measures 3.5 cm compared to 2.7 cm on the prior examination and the temporal tips are dilated. There is hypoattenuation in gray matter adjacent to the ventricles compatible with of subependymal CSF resorption. 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 There is no midline shift, hemorrhage or acute infarct. Vascular: No hyperdense vessel or unexpected calcification. Skull: Intact.  No focal lesion. Sinuses/Orbits: Negative. Other: None. IMPRESSION: Hydrocephalus appears moderate in degree and is worse than on the prior head CT. 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 Electronically Signed   By: Drusilla Kannerhomas  Dalessio M.D.   On: 09/22/2019 11:59   MR BRAIN W WO CONTRAST  Result Date: 09/22/2019 CLINICAL DATA:  Hydrocephalus EXAM: MRI HEAD WITHOUT AND WITH CONTRAST TECHNIQUE: Multiplanar, multiecho pulse sequences of the brain and surrounding structures were obtained without and with intravenous contrast. CONTRAST:  7.605mL GADAVIST GADOBUTROL 1 MMOL/ML IV SOLN COMPARISON:  Prior CT imaging FINDINGS: Brain: There is a 1.1 x 0.9 x 0.7 cm nonenhancing lesion at the anterosuperior aspect of the third ventricle  obstructing the foramen of Monro. Resulting hydrocephalus is present with enlargement of the lateral ventricles and periventricular interstitial edema. Sulcal effacement is noted. There is no significant central herniation at this time. No additional mass or abnormal enhancement. There is no acute infarction or intracranial hemorrhage. No extra-axial fluid collection.  Vascular: Major vessel flow voids at the skull base are preserved. Skull and upper cervical spine: Normal marrow signal is preserved. Sinuses/Orbits: Paranasal sinuses are aerated. Orbits are unremarkable. Other: Sella is unremarkable.  Mastoid air cells are clear. IMPRESSION: Colloid cyst obstructing the foramen of Monro with hydrocephalus and periventricular edema. Electronically Signed   By: Macy Mis M.D.   On: 09/22/2019 17:12    Review of Systems  Constitutional: Negative for activity change, appetite change, chills, diaphoresis, fatigue, fever and unexpected weight change.  Eyes: Negative for photophobia, pain and visual disturbance.  Respiratory: Negative for cough, chest tightness, shortness of breath and wheezing.   Cardiovascular: Negative for chest pain, palpitations and leg swelling.  Gastrointestinal: Positive for nausea. Negative for abdominal distention, abdominal pain, constipation, diarrhea and vomiting.  Endocrine: Negative.   Genitourinary: Negative for difficulty urinating, dysuria, flank pain, frequency and urgency.  Musculoskeletal: Negative for arthralgias, back pain, gait problem, joint swelling, myalgias, neck pain and neck stiffness.  Skin: Negative.   Allergic/Immunologic: Negative.   Neurological: Positive for dizziness, seizures, light-headedness and headaches. Negative for tremors, syncope, facial asymmetry, speech difficulty, weakness and numbness.  Hematological: Negative.   Psychiatric/Behavioral: Negative.     Blood pressure 123/67, pulse 75, temperature 98 F (36.7 C), temperature source  Oral, resp. rate (!) 21, SpO2 98 %. Physical Exam  Constitutional: He is oriented to person, place, and time. He appears well-developed and well-nourished.  HENT:  Head: Normocephalic.  Eyes: Pupils are equal, round, and reactive to light. Conjunctivae and EOM are normal.  Cardiovascular: Normal rate and regular rhythm.  Respiratory: Effort normal and breath sounds normal. No respiratory distress.  GI: Soft. Bowel sounds are normal. He exhibits no distension.  Musculoskeletal:        General: No tenderness. Normal range of motion.     Cervical back: Normal range of motion and neck supple.  Neurological: He is alert and oriented to person, place, and time. He has normal strength and normal reflexes. No cranial nerve deficit or sensory deficit. Coordination normal. GCS eye subscore is 4. GCS verbal subscore is 5. GCS motor subscore is 6.  Skin: Skin is warm and dry.  Psychiatric: He has a normal mood and affect. His behavior is normal. Judgment and thought content normal.     Assessment/Plan Patient with obstructive hydrocephalus due to colloid cyst obstructing the third ventricle. Patient to be admitted to 4NICU. Dr. Zada Finders will assume patient's care in the morning. Surgery tentatively planned for 1500 on 09/23/2019.  Patricia Nettle, NP 09/22/2019, 7:33 PM

## 2019-09-22 NOTE — ED Notes (Signed)
Verified with Pt that he is not claustrophobic before being taken down to MRI

## 2019-09-23 ENCOUNTER — Encounter (HOSPITAL_COMMUNITY): Payer: Self-pay | Admitting: Neurosurgery

## 2019-09-23 ENCOUNTER — Inpatient Hospital Stay (HOSPITAL_COMMUNITY): Payer: BC Managed Care – PPO | Admitting: Certified Registered Nurse Anesthetist

## 2019-09-23 ENCOUNTER — Encounter (HOSPITAL_COMMUNITY)
Admission: EM | Disposition: A | Discharge status: Home / Self Care | Payer: Self-pay | Source: Home / Self Care | Attending: Neurological Surgery

## 2019-09-23 ENCOUNTER — Inpatient Hospital Stay (HOSPITAL_COMMUNITY): Payer: BC Managed Care – PPO

## 2019-09-23 HISTORY — PX: APPLICATION OF CRANIAL NAVIGATION: SHX6578

## 2019-09-23 HISTORY — PX: CRANIOTOMY: SHX93

## 2019-09-23 LAB — SURGICAL PCR SCREEN
MRSA, PCR: NEGATIVE
Staphylococcus aureus: NEGATIVE

## 2019-09-23 LAB — MRSA PCR SCREENING: MRSA by PCR: NEGATIVE

## 2019-09-23 LAB — ABO/RH: ABO/RH(D): O NEG

## 2019-09-23 LAB — HIV ANTIBODY (ROUTINE TESTING W REFLEX): HIV Screen 4th Generation wRfx: NONREACTIVE

## 2019-09-23 SURGERY — CRANIOTOMY TUMOR EXCISION
Anesthesia: General | Laterality: Right

## 2019-09-23 MED ORDER — MANNITOL 25 % IV SOLN
INTRAVENOUS | Status: DC | PRN
  Administered 2019-09-23 (×5): 12.5 g via INTRAVENOUS

## 2019-09-23 MED ORDER — OXYCODONE HCL 5 MG/5ML PO SOLN: 5.0000 mg | Freq: Once | ORAL | Status: DC | PRN

## 2019-09-23 MED ORDER — THROMBIN 20000 UNITS EX SOLR
CUTANEOUS | Status: DC | PRN
  Administered 2019-09-23: 20 mL via TOPICAL

## 2019-09-23 MED ORDER — LIDOCAINE-EPINEPHRINE 1 %-1:100000 IJ SOLN
INTRAMUSCULAR | Status: AC
  Filled 2019-09-23: qty 1

## 2019-09-23 MED ORDER — OXYCODONE HCL 5 MG PO TABS: 5.0000 mg | ORAL_TABLET | Freq: Once | ORAL | Status: DC | PRN

## 2019-09-23 MED ORDER — THROMBIN 5000 UNITS EX SOLR
CUTANEOUS | Status: AC
  Filled 2019-09-23: qty 5000

## 2019-09-23 MED ORDER — DEXAMETHASONE SODIUM PHOSPHATE 4 MG/ML IJ SOLN
INTRAMUSCULAR | Status: DC | PRN
  Administered 2019-09-23: 10 mg via INTRAVENOUS

## 2019-09-23 MED ORDER — ESMOLOL HCL 100 MG/10ML IV SOLN
INTRAVENOUS | Status: DC | PRN
  Administered 2019-09-23: 50 ug via INTRAVENOUS

## 2019-09-23 MED ORDER — LIDOCAINE HCL (CARDIAC) PF 100 MG/5ML IV SOSY
PREFILLED_SYRINGE | INTRAVENOUS | Status: DC | PRN
  Administered 2019-09-23: 80 mg via INTRAVENOUS

## 2019-09-23 MED ORDER — ONDANSETRON HCL 4 MG/2ML IJ SOLN
INTRAMUSCULAR | Status: AC
  Filled 2019-09-23: qty 2

## 2019-09-23 MED ORDER — HYDROMORPHONE HCL 1 MG/ML IJ SOLN
0.5000 mg | INTRAMUSCULAR | Status: DC | PRN
  Administered 2019-09-23 – 2019-09-25 (×12): 0.5 mg via INTRAVENOUS
  Filled 2019-09-23 (×13): qty 1

## 2019-09-23 MED ORDER — ESMOLOL HCL 100 MG/10ML IV SOLN
INTRAVENOUS | Status: AC
  Filled 2019-09-23: qty 10

## 2019-09-23 MED ORDER — SODIUM CHLORIDE 0.9 % IV SOLN
INTRAVENOUS | Status: AC
  Administered 2019-09-23: .1 ug/kg/min via INTRAVENOUS
  Filled 2019-09-23: qty 4000

## 2019-09-23 MED ORDER — CEFAZOLIN SODIUM-DEXTROSE 2-4 GM/100ML-% IV SOLN
INTRAVENOUS | Status: AC
  Filled 2019-09-23: qty 100

## 2019-09-23 MED ORDER — HYDROMORPHONE HCL 1 MG/ML IJ SOLN
INTRAMUSCULAR | Status: AC
  Filled 2019-09-23: qty 1

## 2019-09-23 MED ORDER — SUGAMMADEX SODIUM 200 MG/2ML IV SOLN
INTRAVENOUS | Status: DC | PRN
  Administered 2019-09-23: 200 mg via INTRAVENOUS

## 2019-09-23 MED ORDER — PROMETHAZINE HCL 25 MG/ML IJ SOLN: 6.2500 mg | INTRAMUSCULAR | Status: DC | PRN

## 2019-09-23 MED ORDER — BACITRACIN ZINC 500 UNIT/GM EX OINT
TOPICAL_OINTMENT | CUTANEOUS | Status: AC
  Filled 2019-09-23: qty 28.35

## 2019-09-23 MED ORDER — FENTANYL CITRATE (PF) 100 MCG/2ML IJ SOLN
INTRAMUSCULAR | Status: DC | PRN
  Administered 2019-09-23: 150 ug via INTRAVENOUS
  Administered 2019-09-23: 100 ug via INTRAVENOUS

## 2019-09-23 MED ORDER — THROMBIN 5000 UNITS EX SOLR
OROMUCOSAL | Status: DC | PRN
  Administered 2019-09-23: 5 mL via TOPICAL

## 2019-09-23 MED ORDER — PROPOFOL 10 MG/ML IV BOLUS
INTRAVENOUS | Status: DC | PRN
  Administered 2019-09-23: 200 mg via INTRAVENOUS
  Administered 2019-09-23: 70 mg via INTRAVENOUS
  Administered 2019-09-23: 50 mg via INTRAVENOUS

## 2019-09-23 MED ORDER — ROCURONIUM BROMIDE 10 MG/ML (PF) SYRINGE
PREFILLED_SYRINGE | INTRAVENOUS | Status: AC
  Filled 2019-09-23: qty 20

## 2019-09-23 MED ORDER — PROPOFOL 10 MG/ML IV BOLUS
INTRAVENOUS | Status: AC
  Filled 2019-09-23: qty 20

## 2019-09-23 MED ORDER — HYDROMORPHONE HCL 1 MG/ML IJ SOLN
0.2500 mg | INTRAMUSCULAR | Status: DC | PRN
  Administered 2019-09-23: 0.5 mg via INTRAVENOUS

## 2019-09-23 MED ORDER — HYDROXYZINE HCL 25 MG PO TABS
25.0000 mg | ORAL_TABLET | Freq: Four times a day (QID) | ORAL | Status: DC | PRN
  Administered 2019-09-24 – 2019-10-13 (×11): 25 mg via ORAL
  Filled 2019-09-23 (×17): qty 1

## 2019-09-23 MED ORDER — SODIUM CHLORIDE 0.9 % IV SOLN: INTRAVENOUS | Status: DC

## 2019-09-23 MED ORDER — 0.9 % SODIUM CHLORIDE (POUR BTL) OPTIME
TOPICAL | Status: DC | PRN
  Administered 2019-09-23 (×3): 1000 mL

## 2019-09-23 MED ORDER — BACITRACIN ZINC 500 UNIT/GM EX OINT
TOPICAL_OINTMENT | CUTANEOUS | Status: DC | PRN
  Administered 2019-09-23: 1 via TOPICAL

## 2019-09-23 MED ORDER — FENTANYL CITRATE (PF) 250 MCG/5ML IJ SOLN
INTRAMUSCULAR | Status: AC
  Filled 2019-09-23: qty 5

## 2019-09-23 MED ORDER — THROMBIN 20000 UNITS EX SOLR
CUTANEOUS | Status: AC
  Filled 2019-09-23: qty 20000

## 2019-09-23 MED ORDER — DEXAMETHASONE SODIUM PHOSPHATE 10 MG/ML IJ SOLN
INTRAMUSCULAR | Status: AC
  Filled 2019-09-23: qty 1

## 2019-09-23 MED ORDER — SODIUM CHLORIDE 0.9 % IV SOLN: INTRAVENOUS | Status: DC | PRN

## 2019-09-23 MED ORDER — ONDANSETRON HCL 4 MG/2ML IJ SOLN
INTRAMUSCULAR | Status: DC | PRN
  Administered 2019-09-23: 4 mg via INTRAVENOUS

## 2019-09-23 MED ORDER — CHLORHEXIDINE GLUCONATE CLOTH 2 % EX PADS
6.0000 | MEDICATED_PAD | Freq: Every day | CUTANEOUS | Status: DC
  Administered 2019-09-23 – 2019-10-16 (×19): 6 via TOPICAL

## 2019-09-23 MED ORDER — LIDOCAINE-EPINEPHRINE 1 %-1:100000 IJ SOLN
INTRAMUSCULAR | Status: DC | PRN
  Administered 2019-09-23: 7 mL

## 2019-09-23 MED ORDER — MEPERIDINE HCL 25 MG/ML IJ SOLN: 6.2500 mg | INTRAMUSCULAR | Status: DC | PRN

## 2019-09-23 MED ORDER — MUPIROCIN 2 % EX OINT: 1.0000 "application " | TOPICAL_OINTMENT | Freq: Two times a day (BID) | CUTANEOUS | Status: DC

## 2019-09-23 MED ORDER — HYDROMORPHONE HCL 1 MG/ML IJ SOLN
INTRAMUSCULAR | Status: DC | PRN
  Administered 2019-09-23 (×2): .5 mg via INTRAVENOUS

## 2019-09-23 MED ORDER — HEMOSTATIC AGENTS (NO CHARGE) OPTIME
TOPICAL | Status: DC | PRN
  Administered 2019-09-23: 1 via TOPICAL

## 2019-09-23 MED ORDER — ROCURONIUM BROMIDE 100 MG/10ML IV SOLN
INTRAVENOUS | Status: DC | PRN
  Administered 2019-09-23: 100 mg via INTRAVENOUS
  Administered 2019-09-23: 20 mg via INTRAVENOUS

## 2019-09-23 SURGICAL SUPPLY — 99 items
BENZOIN TINCTURE PRP APPL 2/3 (GAUZE/BANDAGES/DRESSINGS) IMPLANT
BLADE CLIPPER SURG (BLADE) ×3 IMPLANT
BLADE SAW GIGLI 16 STRL (MISCELLANEOUS) IMPLANT
BLADE SURG 15 STRL LF DISP TIS (BLADE) IMPLANT
BLADE SURG 15 STRL SS (BLADE)
BNDG GAUZE ELAST 4 BULKY (GAUZE/BANDAGES/DRESSINGS) IMPLANT
BNDG STRETCH 4X75 STRL LF (GAUZE/BANDAGES/DRESSINGS) IMPLANT
BUR ACORN 9.0 PRECISION (BURR) ×2 IMPLANT
BUR ACORN 9.0MM PRECISION (BURR) ×1
BUR ROUND FLUTED 4 SOFT TCH (BURR) ×1 IMPLANT
BUR ROUND FLUTED 4MM SOFT TCH (BURR) ×1
BUR SPIRAL ROUTER 2.3 (BUR) ×2 IMPLANT
BUR SPIRAL ROUTER 2.3MM (BUR) ×1
CANISTER SUCT 3000ML PPV (MISCELLANEOUS) ×6 IMPLANT
CATH FOLEY 2WAY SLVR  5CC 16FR (CATHETERS) ×4
CATH FOLEY 2WAY SLVR 5CC 16FR (CATHETERS) IMPLANT
CATH VENTRIC 35X38 W/TROCAR LG (CATHETERS) IMPLANT
CLIP VESOCCLUDE MED 6/CT (CLIP) IMPLANT
CONT SPEC 4OZ CLIKSEAL STRL BL (MISCELLANEOUS) ×3 IMPLANT
COVER MAYO STAND STRL (DRAPES) IMPLANT
COVER WAND RF STERILE (DRAPES) ×3 IMPLANT
DECANTER SPIKE VIAL GLASS SM (MISCELLANEOUS) ×3 IMPLANT
DRAIN SUBARACHNOID (WOUND CARE) IMPLANT
DRAPE HALF SHEET 40X57 (DRAPES) ×3 IMPLANT
DRAPE MICROSCOPE LEICA (MISCELLANEOUS) ×2 IMPLANT
DRAPE NEUROLOGICAL W/INCISE (DRAPES) ×3 IMPLANT
DRAPE STERI IOBAN 125X83 (DRAPES) IMPLANT
DRAPE SURG 17X23 STRL (DRAPES) IMPLANT
DRAPE WARM FLUID 44X44 (DRAPES) ×3 IMPLANT
DRSG ADAPTIC 3X8 NADH LF (GAUZE/BANDAGES/DRESSINGS) IMPLANT
DRSG TELFA 3X8 NADH (GAUZE/BANDAGES/DRESSINGS) IMPLANT
DURAPREP 6ML APPLICATOR 50/CS (WOUND CARE) ×3 IMPLANT
ELECT REM PT RETURN 9FT ADLT (ELECTROSURGICAL) ×3
ELECTRODE REM PT RTRN 9FT ADLT (ELECTROSURGICAL) ×1 IMPLANT
EVACUATOR 1/8 PVC DRAIN (DRAIN) IMPLANT
EVACUATOR SILICONE 100CC (DRAIN) IMPLANT
FORCEPS BIPOLAR SPETZLER 8 1.0 (NEUROSURGERY SUPPLIES) ×1 IMPLANT
GAUZE 4X4 16PLY RFD (DISPOSABLE) IMPLANT
GAUZE SPONGE 4X4 12PLY STRL (GAUZE/BANDAGES/DRESSINGS) IMPLANT
GLOVE BIO SURGEON STRL SZ7 (GLOVE) ×2 IMPLANT
GLOVE BIO SURGEON STRL SZ7.5 (GLOVE) ×3 IMPLANT
GLOVE BIOGEL PI IND STRL 7.0 (GLOVE) IMPLANT
GLOVE BIOGEL PI IND STRL 7.5 (GLOVE) ×1 IMPLANT
GLOVE BIOGEL PI INDICATOR 7.0 (GLOVE) ×2
GLOVE BIOGEL PI INDICATOR 7.5 (GLOVE) ×2
GLOVE ECLIPSE 7.5 STRL STRAW (GLOVE) ×6 IMPLANT
GLOVE ECLIPSE 9.0 STRL (GLOVE) ×2 IMPLANT
GLOVE EXAM NITRILE LRG STRL (GLOVE) IMPLANT
GLOVE EXAM NITRILE XL STR (GLOVE) IMPLANT
GLOVE EXAM NITRILE XS STR PU (GLOVE) IMPLANT
GLOVE SURG SS PI 7.5 STRL IVOR (GLOVE) ×4 IMPLANT
GOWN STRL REUS W/ TWL LRG LVL3 (GOWN DISPOSABLE) ×2 IMPLANT
GOWN STRL REUS W/ TWL XL LVL3 (GOWN DISPOSABLE) IMPLANT
GOWN STRL REUS W/TWL 2XL LVL3 (GOWN DISPOSABLE) ×2 IMPLANT
GOWN STRL REUS W/TWL LRG LVL3 (GOWN DISPOSABLE) ×4
GOWN STRL REUS W/TWL XL LVL3 (GOWN DISPOSABLE) ×4
HEMOSTAT POWDER KIT SURGIFOAM (HEMOSTASIS) ×3 IMPLANT
HEMOSTAT SURGICEL 2X14 (HEMOSTASIS) ×1 IMPLANT
HOOK DURA 1/2IN (MISCELLANEOUS) ×3 IMPLANT
IV NS 1000ML (IV SOLUTION)
IV NS 1000ML BAXH (IV SOLUTION) ×1 IMPLANT
KIT BASIN OR (CUSTOM PROCEDURE TRAY) ×3 IMPLANT
KIT DRAIN CSF ACCUDRAIN (MISCELLANEOUS) IMPLANT
KIT TURNOVER KIT B (KITS) ×3 IMPLANT
MARKER SPHERE PSV REFLC 13MM (MARKER) ×6 IMPLANT
NDL SPNL 18GX3.5 QUINCKE PK (NEEDLE) IMPLANT
NEEDLE HYPO 22GX1.5 SAFETY (NEEDLE) ×3 IMPLANT
NEEDLE SPNL 18GX3.5 QUINCKE PK (NEEDLE) IMPLANT
NS IRRIG 1000ML POUR BTL (IV SOLUTION) ×9 IMPLANT
PACK CRANIOTOMY CUSTOM (CUSTOM PROCEDURE TRAY) ×3 IMPLANT
PAD DRESSING TELFA 3X8 NADH (GAUZE/BANDAGES/DRESSINGS) IMPLANT
PATTIES SURGICAL .25X.25 (GAUZE/BANDAGES/DRESSINGS) IMPLANT
PATTIES SURGICAL .5 X.5 (GAUZE/BANDAGES/DRESSINGS) ×2 IMPLANT
PATTIES SURGICAL .5 X3 (DISPOSABLE) IMPLANT
PATTIES SURGICAL 1/4 X 3 (GAUZE/BANDAGES/DRESSINGS) IMPLANT
PATTIES SURGICAL 1X1 (DISPOSABLE) IMPLANT
PIN MAYFIELD SKULL DISP (PIN) ×3 IMPLANT
PLATE 1.5 6HOLE XLONG DBL Y (Plate) ×6 IMPLANT
RUBBERBAND STERILE (MISCELLANEOUS) ×4 IMPLANT
SCREW SELF DRILL HT 1.5/4MM (Screw) ×24 IMPLANT
SPECIMEN JAR SMALL (MISCELLANEOUS) ×6 IMPLANT
SPONGE NEURO XRAY DETECT 1X3 (DISPOSABLE) IMPLANT
SPONGE SURGIFOAM ABS GEL 100 (HEMOSTASIS) ×3 IMPLANT
SPONGE SURGIFOAM ABS GEL SZ50 (HEMOSTASIS) ×2 IMPLANT
STAPLER VISISTAT 35W (STAPLE) ×3 IMPLANT
SUT ETHILON 3 0 FSL (SUTURE) ×2 IMPLANT
SUT ETHILON 3 0 PS 1 (SUTURE) IMPLANT
SUT MNCRL AB 3-0 PS2 18 (SUTURE) IMPLANT
SUT MNCRL AB 3-0 PS2 27 (SUTURE) ×2 IMPLANT
SUT NURALON 4 0 TR CR/8 (SUTURE) ×5 IMPLANT
SUT SILK 0 TIES 10X30 (SUTURE) IMPLANT
SUT VIC AB 2-0 CP2 18 (SUTURE) ×3 IMPLANT
TOWEL GREEN STERILE (TOWEL DISPOSABLE) ×3 IMPLANT
TOWEL GREEN STERILE FF (TOWEL DISPOSABLE) ×3 IMPLANT
TRAY FOLEY MTR SLVR 16FR STAT (SET/KITS/TRAYS/PACK) ×3 IMPLANT
TUBE CONNECTING 12'X1/4 (SUCTIONS) ×1
TUBE CONNECTING 12X1/4 (SUCTIONS) ×2 IMPLANT
UNDERPAD 30X30 (UNDERPADS AND DIAPERS) ×1 IMPLANT
WATER STERILE IRR 1000ML POUR (IV SOLUTION) ×3 IMPLANT

## 2019-09-23 NOTE — Brief Op Note (Signed)
09/23/2019  7:02 PM  PATIENT:  Tommy Austin  23 y.o. 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   Delay start of Pharmacological VTE agent (>24hrs) due to surgical blood loss or risk of bleeding: yes

## 2019-09-23 NOTE — Progress Notes (Signed)
Notified Neurosurgery NP on call Bergman about patient's elevated blood pressure based off of the arterial line, and that there are no PRN blood pressure medications. NP wished for me to get a manual cuff pressure. This RN got a manual cuff pressure.  Manual cuff pressure 145/60. Last automatic cuff pressure 130/83. Current arterial line pressure in 176/69. I was told to go by cuff pressure and and treat/notify practitioner based on that. I also double checked that the discontinued dexamethasone was appropriate. I was told that it was. Will continue to monitor.

## 2019-09-23 NOTE — Transfer of Care (Signed)
Immediate Anesthesia Transfer of Care Note  Patient: Tommy Austin  Procedure(s) Performed: CRANIOTOMY TUMOR EXCISION (Right ) APPLICATION OF CRANIAL NAVIGATION (Right )  Patient Location: PACU  Anesthesia Type:General  Level of Consciousness: awake, alert , oriented and patient cooperative  Airway & Oxygen Therapy: Patient Spontanous Breathing  Post-op Assessment: Report given to RN and Post -op Vital signs reviewed and stable  Post vital signs: Reviewed and stable  Last Vitals:  Vitals Value Taken Time  BP 150/81 09/23/19 1927  Temp    Pulse 108 09/23/19 1931  Resp 22 09/23/19 1931  SpO2 98 % 09/23/19 1931  Vitals shown include unvalidated device data.  Last Pain:  Vitals:   09/23/19 1322  TempSrc:   PainSc: 2          Complications: No apparent anesthesia complications   Neuro assessment same as pre-op assessment.

## 2019-09-23 NOTE — Anesthesia Procedure Notes (Addendum)
Procedure Name: Intubation Date/Time: 09/23/2019 4:39 PM Performed by: Oletta Lamas, CRNA Pre-anesthesia Checklist: Patient identified, Emergency Drugs available, Suction available and Patient being monitored Patient Re-evaluated:Patient Re-evaluated prior to induction Oxygen Delivery Method: Circle System Utilized Preoxygenation: Pre-oxygenation with 100% oxygen Induction Type: IV induction Ventilation: Mask ventilation without difficulty Laryngoscope Size: Miller and 3 Grade View: Grade I Tube type: Oral Tube size: 7.5 mm Number of attempts: 1 Airway Equipment and Method: Stylet and Oral airway Placement Confirmation: ETT inserted through vocal cords under direct vision,  positive ETCO2 and breath sounds checked- equal and bilateral Secured at: 23 cm Tube secured with: Tape Dental Injury: Teeth and Oropharynx as per pre-operative assessment

## 2019-09-23 NOTE — Anesthesia Preprocedure Evaluation (Signed)
Anesthesia Evaluation  Patient identified by MRN, date of birth, ID band Patient awake    Reviewed: Allergy & Precautions, NPO status , Patient's Chart, lab work & pertinent test results  Airway Mallampati: II  TM Distance: >3 FB Neck ROM: Full    Dental no notable dental hx.    Pulmonary neg pulmonary ROS,    Pulmonary exam normal breath sounds clear to auscultation       Cardiovascular negative cardio ROS Normal cardiovascular exam Rhythm:Regular Rate:Normal     Neuro/Psych negative neurological ROS  negative psych ROS   GI/Hepatic negative GI ROS, Neg liver ROS,   Endo/Other  negative endocrine ROS  Renal/GU negative Renal ROS  negative genitourinary   Musculoskeletal negative musculoskeletal ROS (+)   Abdominal   Peds negative pediatric ROS (+)  Hematology negative hematology ROS (+)   Anesthesia Other Findings Brain tumor  Reproductive/Obstetrics negative OB ROS                             Anesthesia Physical Anesthesia Plan  ASA: III  Anesthesia Plan: General   Post-op Pain Management:    Induction: Intravenous  PONV Risk Score and Plan: 2 and Ondansetron, Midazolam and Treatment may vary due to age or medical condition  Airway Management Planned: Oral ETT  Additional Equipment: Arterial line  Intra-op Plan:   Post-operative Plan: Extubation in OR  Informed Consent: I have reviewed the patients History and Physical, chart, labs and discussed the procedure including the risks, benefits and alternatives for the proposed anesthesia with the patient or authorized representative who has indicated his/her understanding and acceptance.     Dental advisory given  Plan Discussed with: CRNA  Anesthesia Plan Comments:         Anesthesia Quick Evaluation

## 2019-09-23 NOTE — Op Note (Signed)
PATIENT: Tommy Austin  DAY OF SURGERY: 09/23/19   PRE-OPERATIVE DIAGNOSIS:  Third ventricular colloid cyst, obstructive hydrocephalus   POST-OPERATIVE DIAGNOSIS:  Same   PROCEDURE:  Craniotomy for resection of third ventricular colloid cyst, use of frameless stereotaxy   SURGEON:  Surgeon(s) and Role:    Judith Part, MD - Primary   ANESTHESIA: ETGA   BRIEF HISTORY: This is a 23 year old man who presented with progressive headaches, nausea, and vomiting. The patient was found to have a colloid cyst with obstructive . This was discussed with the patient as well as risks, benefits, and alternatives and wished to proceed with surgery.   OPERATIVE DETAIL: The patient was taken to the operating room and placed on the OR table in the supine position. A formal time out was performed with two patient identifiers and confirmed the operative site. Anesthesia was induced by the anesthesia team. The [place] head holder was applied to the head and a registration array was attached to the [place]. This was co-registered with the patient's preoperative imaging, the fit appeared to be acceptable. Using frameless stereotaxy, the operative trajectory was planned and the incision was marked. Hair was clipped with surgical clippers over the incision and the area was then prepped and draped in a sterile fashion.  A linear incision was placed to the right of midline over the convexity. A craniotomy was then turned by placing burr holes over the sinus, dissecting free and connecting with a craniotome. The sinus was intact, but there were multiple large draining veins just under the dura that did not allow for a durotomy. I therefore sacrificed one of the veins that was travelling parallel to the sinus and preserved the veins draining into the sinus and flapped the dura on the sinus with good exposure of the falx, but without pressure to avoid occluding the sinus during the case. The brain was very full due  to hydrocephalus, so mannitol was given and this allowed enough space for me to perform the interhemispheric approach, callosotomy, and obtain CSF to decompress the ventricles. Given the location of the thalamostriate vein and choroid, it was clear that I entered into the left lateral ventricle. I cauterized the choroid after it was freed from the ependyma and the cyst was clearly visible. I opened the cyst and, as expected, mucoid material was removed. I then mobilized the cyst wall with careful attention to avoiding traction on the fornix. It appeared that I removed the entire cyst wall, hemostasis was confirmed, the dura was reapproximated, and the bone flap replaced with titanium plates and screws.   All instrument and sponge counts were correct, the incision was then closed in layers. The patient was then returned to anesthesia for emergence. No apparent complications at the completion of the procedure.   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

## 2019-09-23 NOTE — ED Notes (Signed)
Pt reported sharp pain in head.

## 2019-09-23 NOTE — ED Notes (Signed)
Family at bedside. 

## 2019-09-23 NOTE — Anesthesia Procedure Notes (Signed)
Arterial Line Insertion Start/End12/18/2020 4:08 PM Performed by: Alain Marion, CRNA, CRNA  Patient location: Pre-op. Preanesthetic checklist: patient identified, IV checked, site marked, risks and benefits discussed, surgical consent, monitors and equipment checked, pre-op evaluation, timeout performed and anesthesia consent Lidocaine 1% used for infiltration Left, ulnar was placed Catheter size: 20 G Hand hygiene performed  and maximum sterile barriers used   Attempts: 1 Procedure performed without using ultrasound guided technique. Following insertion, dressing applied and Biopatch. Post procedure assessment: normal  Patient tolerated the procedure well with no immediate complications.

## 2019-09-23 NOTE — Progress Notes (Signed)
Neurosurgery NP Reinaldo Meeker paged about patient's anxiety. New orders received for hydroxyzine tablet 25 mg every six hours as needed. Will continue to monitor.

## 2019-09-23 NOTE — Progress Notes (Addendum)
Neurosurgery Service Progress Note  Subjective: No acute events overnight   Objective: Vitals:   09/23/19 0600 09/23/19 0608 09/23/19 0645 09/23/19 0800  BP: (!) 117/55 (!) 117/55 122/64 126/65  Pulse: 61 65 (!) 59 64  Resp: 17 18 14 20   Temp:    99 F (37.2 C)  TempSrc:    Oral  SpO2: 97% 97% 96% 99%   Temp (24hrs), Avg:99 F (37.2 C), Min:99 F (37.2 C), Max:99 F (37.2 C)  CBC Latest Ref Rng & Units 09/22/2019 06/03/2017  WBC 4.0 - 10.5 K/uL 16.6(H) 17.5(H)  Hemoglobin 13.0 - 17.0 g/dL 14.7 14.6  Hematocrit 39.0 - 52.0 % 43.2 42.8  Platelets 150 - 400 K/uL 245 229   BMP Latest Ref Rng & Units 09/22/2019 06/03/2017  Glucose 70 - 99 mg/dL 108(H) 108(H)  BUN 6 - 20 mg/dL 12 9  Creatinine 0.61 - 1.24 mg/dL 0.86 0.92  Sodium 135 - 145 mmol/L 140 135  Potassium 3.5 - 5.1 mmol/L 3.4(L) 3.9  Chloride 98 - 111 mmol/L 104 103  CO2 22 - 32 mmol/L 25 26  Calcium 8.9 - 10.3 mg/dL 9.1 8.9    Intake/Output Summary (Last 24 hours) at 09/23/2019 0858 Last data filed at 09/23/2019 0017 Gross per 24 hour  Intake 1188.59 ml  Output --  Net 1188.59 ml    Current Facility-Administered Medications:  .  0.9 %  sodium chloride infusion, , Intravenous, Continuous, Judith Part, MD, Last Rate: 75 mL/hr at 09/23/19 0835, New Bag at 09/23/19 0835 .  acetaminophen (TYLENOL) tablet 650 mg, 650 mg, Oral, Q6H PRN **OR** acetaminophen (TYLENOL) suppository 650 mg, 650 mg, Rectal, Q6H PRN, Reinaldo Meeker, Meghan D, NP .  Chlorhexidine Gluconate Cloth 2 % PADS 6 each, 6 each, Topical, Daily, Benett Swoyer A, MD .  dexamethasone (DECADRON) injection 4 mg, 4 mg, Intravenous, Q6H, Bergman, Meghan D, NP, 4 mg at 09/23/19 0517 .  HYDROcodone-acetaminophen (NORCO/VICODIN) 5-325 MG per tablet 1-2 tablet, 1-2 tablet, Oral, Q4H PRN, Viona Gilmore D, NP, 2 tablet at 09/22/19 2242 .  HYDROmorphone (DILAUDID) injection 0.5 mg, 0.5 mg, Intravenous, Q2H PRN, Judith Part, MD, 0.5 mg at 09/23/19  0748 .  levETIRAcetam (KEPPRA) IVPB 500 mg/100 mL premix, 500 mg, Intravenous, Q12H, Bergman, Meghan D, NP, Stopped at 09/23/19 0017 .  ondansetron (ZOFRAN) tablet 4 mg, 4 mg, Oral, Q6H PRN **OR** ondansetron (ZOFRAN) injection 4 mg, 4 mg, Intravenous, Q6H PRN, Reinaldo Meeker, Meghan D, NP   Physical Exam: AOx3, PERRL, EOMI, FS, Strength 5/5 x4, SILTx4, no drift  Assessment & Plan: 23 y.o. man with progressive headaches, nausea, and vomiting. CTH/MRI show colloid cyst with obstructive hydrocephalus.   -OR today for craniotomy and colloid cyst resesction -volumetric / brainlab CT ordered, will need before surgery -NPO -hold SQH -explained pathology/findings/etc to pt and his mother, discussed surgery and expectations, warned them of possible memory issues post-op due to forniceal manipulation, which we will minimize as much as possible  Judith Part  09/23/19 8:58 AM

## 2019-09-23 NOTE — Anesthesia Postprocedure Evaluation (Signed)
Anesthesia Post Note  Patient: Tommy Austin  Procedure(s) Performed: CRANIOTOMY TUMOR EXCISION (Right ) APPLICATION OF CRANIAL NAVIGATION (Right )     Patient location during evaluation: PACU Anesthesia Type: General Level of consciousness: awake and alert Pain management: pain level controlled Vital Signs Assessment: post-procedure vital signs reviewed and stable Respiratory status: spontaneous breathing, nonlabored ventilation and respiratory function stable Cardiovascular status: blood pressure returned to baseline and stable Postop Assessment: no apparent nausea or vomiting Anesthetic complications: no    Last Vitals:  Vitals:   09/23/19 1945 09/23/19 1957  BP: (!) 142/79 139/82  Pulse: 96 99  Resp: 19 15  Temp:  36.9 C  SpO2: 100% 98%    Last Pain:  Vitals:   09/23/19 1957  TempSrc:   PainSc: 3     LLE Motor Response: Purposeful movement;Responds to commands (09/23/19 1957) LLE Sensation: Full sensation (09/23/19 1957) RLE Motor Response: Purposeful movement;Responds to commands (09/23/19 1957) RLE Sensation: Full sensation (09/23/19 1957)      Jefm Petty. EDMOND

## 2019-09-23 NOTE — ED Notes (Signed)
Report called to nurse Raquel Sarna on [facility]

## 2019-09-24 ENCOUNTER — Inpatient Hospital Stay (HOSPITAL_COMMUNITY): Payer: BC Managed Care – PPO

## 2019-09-24 NOTE — Progress Notes (Signed)
Updated Neurosurgery NP Reinaldo Meeker that we have returned from [place]. This RN made NP aware that patient's heart rate is hanging out in the high 40s and low 50s. Patient is asymptomatic for bradycardic issues. Patient currently not having nausea. No new orders received at this time. Will continue to monitor.

## 2019-09-24 NOTE — Progress Notes (Signed)
Contacted Neurosurgery NP Reinaldo Meeker about patient's nausea and vomiting not being relieved by zofran. NP wished to go ahead and do the head CT now instead of waiting till midnight. Will continue to monitor.

## 2019-09-24 NOTE — Progress Notes (Signed)
Postop day 1.  Overall feeling better.  Less headache.  Mental status much better.  Denies incisional discomfort.  No new complaints of numbness paresthesias or weakness.  He is afebrile.  His heart rate and blood pressure are normal.  He is awake and alert.  He is mildly confused.  Speech is fluent.  Motor and sensory function intact.  Cranial nerve function normal bilaterally.  Status post resection of colloid cyst.  Follow-up head CT scan today demonstrates hemorrhage within the operative bed.  Ventricles markedly decompressed with some intraventricular air.  Certainly would be hemorrhage at the operative site is worrisome for possible obstructive hydrocephalus.  Continue ICU observation.  Plan follow-up head CT scan in the morning.

## 2019-09-25 ENCOUNTER — Inpatient Hospital Stay (HOSPITAL_COMMUNITY): Payer: BC Managed Care – PPO

## 2019-09-25 LAB — BASIC METABOLIC PANEL
Anion gap: 9 (ref 5–15)
BUN: 5 mg/dL — ABNORMAL LOW (ref 6–20)
CO2: 27 mmol/L (ref 22–32)
Calcium: 9.3 mg/dL (ref 8.9–10.3)
Chloride: 102 mmol/L (ref 98–111)
Creatinine, Ser: 0.72 mg/dL (ref 0.61–1.24)
GFR calc Af Amer: >60 mL/min (ref >60)
GFR calc non Af Amer: >60 mL/min (ref >60)
Glucose, Bld: 128 mg/dL — ABNORMAL HIGH (ref 70–99)
Potassium: 3.3 mmol/L — ABNORMAL LOW (ref 3.5–5.1)
Sodium: 138 mmol/L (ref 135–145)

## 2019-09-25 LAB — BPAM RBC
Blood Product Expiration Date: 202012242359
ISSUE DATE / TIME: 202012181235
Unit Type and Rh: 9500

## 2019-09-25 LAB — CBC
HCT: 40.7 % (ref 39.0–52.0)
Hemoglobin: 14.4 g/dL (ref 13.0–17.0)
MCH: 31.6 pg (ref 26.0–34.0)
MCHC: 35.4 g/dL (ref 30.0–36.0)
MCV: 89.5 fL (ref 80.0–100.0)
Platelets: 227 10*3/uL (ref 150–400)
RBC: 4.55 MIL/uL (ref 4.22–5.81)
RDW: 11.7 % (ref 11.5–15.5)
WBC: 13.4 10*3/uL — ABNORMAL HIGH (ref 4.0–10.5)
nRBC: 0 % (ref 0.0–0.2)

## 2019-09-25 LAB — TYPE AND SCREEN
ABO/RH(D): O NEG
Antibody Screen: NEGATIVE
Unit division: 0

## 2019-09-25 MED ORDER — MIDAZOLAM HCL 2 MG/2ML IJ SOLN: 1.0000 mg | Freq: Once | INTRAMUSCULAR | Status: AC

## 2019-09-25 MED ORDER — MIDAZOLAM HCL 2 MG/2ML IJ SOLN
INTRAMUSCULAR | Status: AC
  Administered 2019-09-25: 1 mg
  Filled 2019-09-25: qty 4

## 2019-09-25 MED ORDER — FENTANYL CITRATE (PF) 100 MCG/2ML IJ SOLN: 100.0000 ug | Freq: Once | INTRAMUSCULAR | Status: AC

## 2019-09-25 MED ORDER — FENTANYL CITRATE (PF) 100 MCG/2ML IJ SOLN
INTRAMUSCULAR | Status: AC
  Administered 2019-09-25: 50 ug
  Filled 2019-09-25: qty 4

## 2019-09-25 MED ORDER — OXYCODONE HCL 5 MG PO TABS
5.0000 mg | ORAL_TABLET | ORAL | Status: DC | PRN
  Administered 2019-09-26: 5 mg via ORAL
  Administered 2019-09-26: 10 mg via ORAL
  Administered 2019-09-27: 5 mg via ORAL
  Administered 2019-09-28 – 2019-10-06 (×17): 10 mg via ORAL
  Administered 2019-10-06: 5 mg via ORAL
  Administered 2019-10-07 – 2019-10-11 (×18): 10 mg via ORAL
  Filled 2019-09-25 (×13): qty 2
  Filled 2019-09-25: qty 1
  Filled 2019-09-25 (×5): qty 2
  Filled 2019-09-25 (×2): qty 1
  Filled 2019-09-25 (×11): qty 2
  Filled 2019-09-25: qty 1
  Filled 2019-09-25 (×6): qty 2

## 2019-09-25 MED ORDER — HYDROMORPHONE HCL 1 MG/ML IJ SOLN
0.5000 mg | INTRAMUSCULAR | Status: DC | PRN
  Administered 2019-09-25 – 2019-10-07 (×15): 0.5 mg via INTRAVENOUS
  Filled 2019-09-25 (×16): qty 1

## 2019-09-25 MED ORDER — PROMETHAZINE HCL 25 MG/ML IJ SOLN
12.5000 mg | Freq: Four times a day (QID) | INTRAMUSCULAR | Status: DC | PRN
  Administered 2019-09-25: 25 mg via INTRAVENOUS
  Administered 2019-09-25: 12.5 mg via INTRAVENOUS
  Administered 2019-09-28 – 2019-10-08 (×5): 25 mg via INTRAVENOUS
  Filled 2019-09-25 (×8): qty 1

## 2019-09-25 NOTE — Progress Notes (Signed)
Pt becoming more lethargic as the day has progressed. A&O x 4 normally. Now only able to tell RN his name after repeated stimulation. Vinnie PA called. Stated he was coming to see pt. Stat head CT then ordered.

## 2019-09-25 NOTE — Procedures (Addendum)
  NEUROSURGERY PROCEDURE NOTE    PREOP DX: Hydrocephalus  POSTOP DX: Same  PROCEDURE: right frontal ventriculostomy   SURGEON: Dr. Consuella Lose, MD  ANESTHESIA: IV Sedation (1mg  versed, 50 micrograms fentanyl) with 20cc lidocaine with epi  EBL: Minimal  SPECIMENS: None  COMPLICATIONS: None  CONDITION: Hemodynamically stable  INDICATIONS: Mrs. Tommy Austin is a 23 y.o. male POD#2 from resection of colloid cyst with progressive development of hydrocephalus postop. Placement of EVD is therefore indicated. Risks, benefits, and indications were reviewed with the patient's mother and consent was obtained.  PROCEDURE IN DETAIL: The skin of the right frontal scalp just lateral and slightly anterior to the operative incision was clipped, prepped and draped in the usual sterile fashion.  Scalp was then infiltrated with local anesthetic with epinephrine.  Skin incision was made sharply, and twist drill burr hole was made.  The dura was then incised, and the ventricular catheter was passed in a single attempt into the right lateral ventricle.  Good CSF flow was obtained.  The catheter was then tunneled subcutaneously and connected to a drainage system and the skin incision closed.  The drain was then secured in place. Popoff drainage system opened at 23mmHg above EAM.  FINDINGS: 1. Opening pressure approximately 20cm H2O 2. Slightly blood tinged CSF

## 2019-09-25 NOTE — Progress Notes (Signed)
Updated Neurosurgery NP Reinaldo Meeker about patient's vomiting episodes despite use of zofran. I also let NP Bergman know that patient has continued to stay bradycardic, with episodes of pauses. No new orders received at this time. Will continue to monitor.

## 2019-09-25 NOTE — Progress Notes (Signed)
  NEUROSURGERY PROGRESS NOTE   Received call from nursing regarding change in metal status - no longer oriented x4, now lethargic. Immediately came by to evaluate the patient.   On exam, he is lethargic. Easily awakened but difficulties staying awake. Oriented to self, location but not year. Follows commands. Grossly normal strength.  A/P:  Change in mental status: lethargy Stat head CT ordered. BMP, CBC.  Addendum CT shows worsening hydrocephalus. Will proceed with EVD placement. Discussed with mom risks benefits. States understanding. Consent signed.

## 2019-09-25 NOTE — Progress Notes (Signed)
  NEUROSURGERY PROGRESS NOTE   Pt seen and examined. Pt significantly nauseated overnight.  EXAM: Temp:  [98 F (36.7 C)-99 F (37.2 C)] 98.8 F (37.1 C) (12/20 0400) Pulse Rate:  [41-105] 46 (12/20 0800) Resp:  [12-27] 19 (12/20 0800) BP: (94-136)/(53-86) 120/64 (12/20 0800) SpO2:  [95 %-100 %] 98 % (12/20 0800) Intake/Output      12/19 0701 - 12/20 0700 12/20 0701 - 12/21 0700   P.O. 360    I.V. (mL/kg) 980.2 (11.2) 75 (0.9)   IV Piggyback 175.9    Total Intake(mL/kg) 1516 (17.3) 75 (0.9)   Urine (mL/kg/hr) 1150 (0.5)    Emesis/NG output 0    Blood     Total Output 1150    Net +366 +75        Urine Occurrence 2 x    Emesis Occurrence 6 x     Drowsy but easily arouses, oriented Speech fluent CN intact Good strength throughout Wound c/d/i  LABS: Lab Results  Component Value Date   CREATININE 0.86 09/22/2019   BUN 12 09/22/2019   NA 140 09/22/2019   K 3.4 (L) 09/22/2019   CL 104 09/22/2019   CO2 25 09/22/2019   Lab Results  Component Value Date   WBC 16.6 (H) 09/22/2019   HGB 14.7 09/22/2019   HCT 43.2 09/22/2019   MCV 90.2 09/22/2019   PLT 245 09/22/2019    IMAGING: CT head personally reviewed, stable appearance of ventricular size and IVH s/p resection.  IMPRESSION: - 23 y.o. male POD# 2 s/p resection of colloid cyst. Appears neurologically stable, no clinical or radiographic signs of HCP.  PLAN: - Will change anti-emetic to phenergan - Cont supportive care in ICU

## 2019-09-26 ENCOUNTER — Encounter: Payer: Self-pay | Admitting: *Deleted

## 2019-09-26 LAB — SURGICAL PATHOLOGY

## 2019-09-26 MED ORDER — LEVETIRACETAM 500 MG PO TABS
500.0000 mg | ORAL_TABLET | Freq: Two times a day (BID) | ORAL | Status: DC
  Administered 2019-09-26 – 2019-10-16 (×40): 500 mg via ORAL
  Filled 2019-09-26 (×40): qty 1

## 2019-09-26 NOTE — Discharge Summary (Addendum)
Discharge Summary  Date of Admission: 09/22/2019  Date of Discharge: 10/16/19  Attending Physician: Emelda Brothers, MD  Hospital Course: Patient was admitted for 3 weeks of progressive headaches and nausea with a GTC on 09/22/19. CT and MRI of the brain showed a third ventricular colloid cyst with ventriculomegaly / obstructive hydrocephalus. He was taken to the OR on 12/18 for a right interhemispheric craniotomy for colloid cyst resection. Post-operatively, he returned to his neurologic baseline and CTH showed decreasing ventricles. But on 12/20 he had progressive somnolence and nausea, CTH showed ventricular enlargement and an EVD was placed. This EVD failed on 12/25 with clinical evidence of hydrocephalus, so it was replaced on 12/25. He had a repeat clamp trial on 12/30 with one episode of emesis, ICP transduced at [facility] and repeat [facility] showed decompressed ventricles. He continued to pass this clamp trial successfully so on 12/31 his EVD was removed. He again had worsening of his headache and nausea on 1/2 so an LP was performed that showed an opening pressure of 39cm H2O and resolved his symptoms. CSF findings were consistent with ventriculitis, so he was started on  Vancomycin and ceftriaxone.  After starting antibiotics, his symptoms continue to resolve and improve.  He completed a 7 day course of both vancomycin and ceftriaxone.  CSF cultures were never positive but CSF numbers were very consistent with bacterial infection. On 1/9 he finished his last dose of IV antibiotics and was asymptomatic. His hospital course was otherwise uncomplicated and the patient was discharged home on 10/15/2018. Final pathology confirmed colloid cyst. He will follow up in clinic with Dr Zada Finders in 2 weeks.  Neurologic exam at discharge:  AOx3, PERRL, EOMI, FS, TM Strength 5/5 x4, SILTx4, no drift  Discharge diagnosis: Colloid cyst of the third ventricle, obstructive hydrocephalus  Tommy Cude J,  PA-C 10/16/19 8:39 AM

## 2019-09-26 NOTE — Progress Notes (Signed)
Neurosurgery Service Progress Note  Subjective: Had clinical hydrocephalus w/ ventriculomegaly on CT, had R F EVD placed yesterday afternoon  Objective: Vitals:   09/26/19 0400 09/26/19 0500 09/26/19 0600 09/26/19 0700  BP: 110/60 114/63 130/76 125/71  Pulse: 61 (!) 52 73 71  Resp: 14 15 14 16   Temp: 98.8 F (37.1 C)     TempSrc: Oral     SpO2: 96% 97% 97% 98%  Weight:       Temp (24hrs), Avg:98.4 F (36.9 C), Min:98.2 F (36.8 C), Max:98.8 F (37.1 C)  CBC Latest Ref Rng & Units 09/25/2019 09/22/2019 06/03/2017  WBC 4.0 - 10.5 K/uL 13.4(H) 16.6(H) 17.5(H)  Hemoglobin 13.0 - 17.0 g/dL 14.4 14.7 14.6  Hematocrit 39.0 - 52.0 % 40.7 43.2 42.8  Platelets 150 - 400 K/uL 227 245 229   BMP Latest Ref Rng & Units 09/25/2019 09/22/2019 06/03/2017  Glucose 70 - 99 mg/dL 128(H) 108(H) 108(H)  BUN 6 - 20 mg/dL 5(L) 12 9  Creatinine 0.61 - 1.24 mg/dL 0.72 0.86 0.92  Sodium 135 - 145 mmol/L 138 140 135  Potassium 3.5 - 5.1 mmol/L 3.3(L) 3.4(L) 3.9  Chloride 98 - 111 mmol/L 102 104 103  CO2 22 - 32 mmol/L 27 25 26   Calcium 8.9 - 10.3 mg/dL 9.3 9.1 8.9    Intake/Output Summary (Last 24 hours) at 09/26/2019 0749 Last data filed at 09/26/2019 0700 Gross per 24 hour  Intake 1976.15 ml  Output 1226 ml  Net 750.15 ml    Current Facility-Administered Medications:  .  0.9 %  sodium chloride infusion, , Intravenous, Continuous, Bailey Kolbe, Joyice Faster, MD, Last Rate: 75 mL/hr at 09/26/19 0700, Rate Verify at 09/26/19 0700 .  acetaminophen (TYLENOL) tablet 650 mg, 650 mg, Oral, Q6H PRN, 650 mg at 09/25/19 1022 **OR** acetaminophen (TYLENOL) suppository 650 mg, 650 mg, Rectal, Q6H PRN, Ordell Prichett, Joyice Faster, MD .  Chlorhexidine Gluconate Cloth 2 % PADS 6 each, 6 each, Topical, Daily, Judith Part, MD, 6 each at 09/25/19 1400 .  HYDROcodone-acetaminophen (NORCO/VICODIN) 5-325 MG per tablet 1-2 tablet, 1-2 tablet, Oral, Q4H PRN, Judith Part, MD, 2 tablet at 09/26/19 0604 .   HYDROmorphone (DILAUDID) injection 0.5 mg, 0.5 mg, Intravenous, Q2H PRN, Costella, Vincent J, PA-C, 0.5 mg at 09/25/19 2001 .  hydrOXYzine (ATARAX/VISTARIL) tablet 25 mg, 25 mg, Oral, Q6H PRN, Reinaldo Meeker, Meghan D, NP, 25 mg at 09/25/19 0545 .  levETIRAcetam (KEPPRA) IVPB 500 mg/100 mL premix, 500 mg, Intravenous, Q12H, Bergman, Meghan D, NP, Stopped at 09/25/19 2227 .  ondansetron (ZOFRAN) tablet 4 mg, 4 mg, Oral, Q6H PRN **OR** ondansetron (ZOFRAN) injection 4 mg, 4 mg, Intravenous, Q6H PRN, Judith Part, MD, 4 mg at 09/26/19 0604 .  oxyCODONE (Oxy IR/ROXICODONE) immediate release tablet 5-10 mg, 5-10 mg, Oral, Q3H PRN, Costella, Vincent J, PA-C .  promethazine (PHENERGAN) injection 12.5-25 mg, 12.5-25 mg, Intravenous, Q6H PRN, Consuella Lose, MD, 25 mg at 09/25/19 2232   Physical Exam: AOx3, PERRL, EOMI, FS, Strength 5/5 x4, SILTx4, no drift  Assessment & Plan: 23 y.o. man with progressive headaches, nausea, and vomiting. CTH/MRI show colloid cyst with obstructive hydrocephalus. 12/18 s/p craniotomy for colloid cyst resection, post-op CTH w/ decreased ventricular size, some resection bed IVH, 12/20 EVD replaced for hydrocephalus  -plan for EVD drainage x3 days then clamp trial, IVH was decreasing on last CT -SCDs/TEDs, given resection bed hemorrhage, will hold SQH until POD5 depending on timing of drain removal  Judith Part  09/26/19 7:49  AM

## 2019-09-27 MED ORDER — POLYETHYLENE GLYCOL 3350 17 G PO PACK
17.0000 g | PACK | Freq: Every day | ORAL | Status: DC | PRN
  Administered 2019-09-27 – 2019-10-10 (×6): 17 g via ORAL
  Filled 2019-09-27 (×7): qty 1

## 2019-09-27 MED ORDER — MAGNESIUM CITRATE PO SOLN
1.0000 | Freq: Once | ORAL | Status: AC | PRN
  Administered 2019-10-05: 1 via ORAL
  Filled 2019-09-27: qty 296

## 2019-09-27 MED ORDER — DOCUSATE SODIUM 100 MG PO CAPS
100.0000 mg | ORAL_CAPSULE | Freq: Two times a day (BID) | ORAL | Status: DC | PRN
  Administered 2019-09-27 – 2019-09-28 (×2): 100 mg via ORAL
  Filled 2019-09-27 (×2): qty 1

## 2019-09-27 NOTE — Progress Notes (Signed)
Neurosurgery Service Progress Note  Subjective: NAE ON   Objective: Vitals:   09/27/19 0200 09/27/19 0400 09/27/19 0500 09/27/19 0600  BP:   114/66 124/77  Pulse: (!) 56 (!) 55 (!) 53 65  Resp: 20 15 15 16   Temp:  98.8 F (37.1 C)    TempSrc:  Oral    SpO2: 97% 98% 98% 99%  Weight:       Temp (24hrs), Avg:98.7 F (37.1 C), Min:98.3 F (36.8 C), Max:99 F (37.2 C)  CBC Latest Ref Rng & Units 09/25/2019 09/22/2019 06/03/2017  WBC 4.0 - 10.5 K/uL 13.4(H) 16.6(H) 17.5(H)  Hemoglobin 13.0 - 17.0 g/dL 14.4 14.7 14.6  Hematocrit 39.0 - 52.0 % 40.7 43.2 42.8  Platelets 150 - 400 K/uL 227 245 229   BMP Latest Ref Rng & Units 09/25/2019 09/22/2019 06/03/2017  Glucose 70 - 99 mg/dL 128(H) 108(H) 108(H)  BUN 6 - 20 mg/dL 5(L) 12 9  Creatinine 0.61 - 1.24 mg/dL 0.72 0.86 0.92  Sodium 135 - 145 mmol/L 138 140 135  Potassium 3.5 - 5.1 mmol/L 3.3(L) 3.4(L) 3.9  Chloride 98 - 111 mmol/L 102 104 103  CO2 22 - 32 mmol/L 27 25 26   Calcium 8.9 - 10.3 mg/dL 9.3 9.1 8.9    Intake/Output Summary (Last 24 hours) at 09/27/2019 0742 Last data filed at 09/27/2019 0700 Gross per 24 hour  Intake 1608.46 ml  Output 1597 ml  Net 11.46 ml    Current Facility-Administered Medications:  .  0.9 %  sodium chloride infusion, , Intravenous, Continuous, Laney Louderback, Joyice Faster, MD, Last Rate: 75 mL/hr at 09/27/19 0500, Rate Verify at 09/27/19 0500 .  acetaminophen (TYLENOL) tablet 650 mg, 650 mg, Oral, Q6H PRN, 650 mg at 09/26/19 1639 **OR** acetaminophen (TYLENOL) suppository 650 mg, 650 mg, Rectal, Q6H PRN, Judith Part, MD .  Chlorhexidine Gluconate Cloth 2 % PADS 6 each, 6 each, Topical, Daily, Judith Part, MD, 6 each at 09/26/19 0900 .  HYDROcodone-acetaminophen (NORCO/VICODIN) 5-325 MG per tablet 1-2 tablet, 1-2 tablet, Oral, Q4H PRN, Judith Part, MD, 2 tablet at 09/27/19 0034 .  HYDROmorphone (DILAUDID) injection 0.5 mg, 0.5 mg, Intravenous, Q2H PRN, Costella, Vincent J, PA-C, 0.5  mg at 09/25/19 2001 .  hydrOXYzine (ATARAX/VISTARIL) tablet 25 mg, 25 mg, Oral, Q6H PRN, Reinaldo Meeker, Meghan D, NP, 25 mg at 09/25/19 0545 .  levETIRAcetam (KEPPRA) tablet 500 mg, 500 mg, Oral, BID, Judith Part, MD, 500 mg at 09/26/19 2152 .  ondansetron (ZOFRAN) tablet 4 mg, 4 mg, Oral, Q6H PRN **OR** ondansetron (ZOFRAN) injection 4 mg, 4 mg, Intravenous, Q6H PRN, Judith Part, MD, 4 mg at 09/26/19 0604 .  oxyCODONE (Oxy IR/ROXICODONE) immediate release tablet 5-10 mg, 5-10 mg, Oral, Q3H PRN, Costella, Vincent J, PA-C, 5 mg at 09/26/19 1759 .  promethazine (PHENERGAN) injection 12.5-25 mg, 12.5-25 mg, Intravenous, Q6H PRN, Consuella Lose, MD, 25 mg at 09/25/19 2232   Physical Exam: AOx3, PERRL, EOMI, FS, Strength 5/5 x4, SILTx4, no drift  Assessment & Plan: 23 y.o. man with progressive headaches, nausea, and vomiting. CTH/MRI show colloid cyst with obstructive hydrocephalus. 12/18 s/p craniotomy for colloid cyst resection, post-op CTH w/ decreased ventricular size, some resection bed IVH, 12/20 EVD replaced for hydrocephalus  -EVD clamp trial tomorrow 12/23 -SCDs/TEDs, hold SQH until EVD d/c'd  Judith Part  09/27/19 7:42 AM

## 2019-09-28 MED ORDER — HEPARIN SODIUM (PORCINE) 5000 UNIT/ML IJ SOLN
5000.0000 [IU] | Freq: Three times a day (TID) | INTRAMUSCULAR | Status: DC
  Administered 2019-09-28 – 2019-10-15 (×49): 5000 [IU] via SUBCUTANEOUS
  Filled 2019-09-28 (×50): qty 1

## 2019-09-28 NOTE — Progress Notes (Signed)
Neurosurgery Service Progress Note  Subjective: NAE ON   Objective: Vitals:   09/28/19 1400 09/28/19 1500 09/28/19 1600 09/28/19 1700  BP: (!) 121/52 116/62 96/60 139/64  Pulse: 65 60 (!) 59 (!) 58  Resp: 18 19 17 19   Temp:      TempSrc:      SpO2: 97% 98% 98% 97%  Weight:       Temp (24hrs), Avg:98.4 F (36.9 C), Min:98 F (36.7 C), Max:98.7 F (37.1 C)  CBC Latest Ref Rng & Units 09/25/2019 09/22/2019 06/03/2017  WBC 4.0 - 10.5 K/uL 13.4(H) 16.6(H) 17.5(H)  Hemoglobin 13.0 - 17.0 g/dL 06/05/2017 17.7 93.9  Hematocrit 39.0 - 52.0 % 40.7 43.2 42.8  Platelets 150 - 400 K/uL 227 245 229   BMP Latest Ref Rng & Units 09/25/2019 09/22/2019 06/03/2017  Glucose 70 - 99 mg/dL 06/05/2017) 092(Z) 300(T)  BUN 6 - 20 mg/dL 5(L) 12 9  Creatinine 622(Q - 1.24 mg/dL 3.33 5.45 6.25  Sodium 135 - 145 mmol/L 138 140 135  Potassium 3.5 - 5.1 mmol/L 3.3(L) 3.4(L) 3.9  Chloride 98 - 111 mmol/L 102 104 103  CO2 22 - 32 mmol/L 27 25 26   Calcium 8.9 - 10.3 mg/dL 9.3 9.1 8.9    Intake/Output Summary (Last 24 hours) at 09/28/2019 1809 Last data filed at 09/28/2019 1700 Gross per 24 hour  Intake 3564.89 ml  Output 2691 ml  Net 873.89 ml    Current Facility-Administered Medications:  .  0.9 %  sodium chloride infusion, , Intravenous, Continuous, Alima Naser, 09/30/2019, MD, Last Rate: 75 mL/hr at 09/28/19 1700, Rate Verify at 09/28/19 1700 .  acetaminophen (TYLENOL) tablet 650 mg, 650 mg, Oral, Q6H PRN, 650 mg at 09/26/19 1639 **OR** acetaminophen (TYLENOL) suppository 650 mg, 650 mg, Rectal, Q6H PRN, 09/30/19, MD .  Chlorhexidine Gluconate Cloth 2 % PADS 6 each, 6 each, Topical, Daily, 09/28/19, MD, 6 each at 09/28/19 0800 .  docusate sodium (COLACE) capsule 100 mg, 100 mg, Oral, BID PRN, Costella, Vincent J, PA-C, 100 mg at 09/28/19 0926 .  HYDROcodone-acetaminophen (NORCO/VICODIN) 5-325 MG per tablet 1-2 tablet, 1-2 tablet, Oral, Q4H PRN, 09/30/19, MD, 1 tablet at 09/28/19  1732 .  HYDROmorphone (DILAUDID) injection 0.5 mg, 0.5 mg, Intravenous, Q2H PRN, Costella, Vincent J, PA-C, 0.5 mg at 09/25/19 2001 .  hydrOXYzine (ATARAX/VISTARIL) tablet 25 mg, 25 mg, Oral, Q6H PRN, 09/27/19, Meghan D, NP, 25 mg at 09/25/19 0545 .  levETIRAcetam (KEPPRA) tablet 500 mg, 500 mg, Oral, BID, Doran Durand, MD, 500 mg at 09/28/19 0916 .  magnesium citrate solution 1 Bottle, 1 Bottle, Oral, Once PRN, Costella, Vincent J, PA-C .  ondansetron (ZOFRAN) tablet 4 mg, 4 mg, Oral, Q6H PRN **OR** ondansetron (ZOFRAN) injection 4 mg, 4 mg, Intravenous, Q6H PRN, Jadene Pierini, MD, 4 mg at 09/28/19 1006 .  oxyCODONE (Oxy IR/ROXICODONE) immediate release tablet 5-10 mg, 5-10 mg, Oral, Q3H PRN, Costella, Vincent J, PA-C, 10 mg at 09/28/19 1013 .  polyethylene glycol (MIRALAX / GLYCOLAX) packet 17 g, 17 g, Oral, Daily PRN, Costella, Vincent J, PA-C, 17 g at 09/28/19 1203 .  promethazine (PHENERGAN) injection 12.5-25 mg, 12.5-25 mg, Intravenous, Q6H PRN, 11-05-1983, MD, 25 mg at 09/28/19 1305   Physical Exam: AOx3, PERRL, EOMI, FS, Strength 5/5 x4, SILTx4, no drift  Assessment & Plan: 23 y.o. man with progressive headaches, nausea, and vomiting. CTH/MRI show colloid cyst with obstructive hydrocephalus. 12/18 s/p craniotomy for colloid cyst resection,  post-op CTH w/ decreased ventricular size, some resection bed IVH, 12/20 EVD replaced for hydrocephalus, 12/23 failed EVD clamp trial with severe headache and nausea  -re-open EVD at +15 -repeat EVD clamp trial 12/26 -SCDs/TEDs, start [place]  09/28/19 6:09 PM

## 2019-09-29 MED ORDER — DOCUSATE SODIUM 100 MG PO CAPS
100.0000 mg | ORAL_CAPSULE | Freq: Every day | ORAL | Status: DC
  Administered 2019-09-29 – 2019-10-16 (×16): 100 mg via ORAL
  Filled 2019-09-29 (×16): qty 1

## 2019-09-29 MED ORDER — BISACODYL 10 MG RE SUPP: 10.0000 mg | Freq: Every day | RECTAL | Status: DC | PRN

## 2019-09-29 NOTE — Progress Notes (Signed)
Subjective: Patient sitting up in chair at bedside, IVC draining well.  Describes some low back discomfort.  Objective: Vital signs in last 24 hours: Vitals:   09/29/19 0500 09/29/19 0600 09/29/19 0700 09/29/19 0800  BP: 128/64 117/63 124/64 118/72  Pulse: 88 80 67 84  Resp: 15 12 (!) 22 (!) 22  Temp:    97.9 F (36.6 C)  TempSrc:    Axillary  SpO2: 97% 98% 98% 95%  Weight:        Intake/Output from previous day: 12/23 0701 - 12/24 0700 In: 2666.2 [P.O.:940; I.V.:1726.2] Out: 2420 [Urine:2200; Drains:220] Intake/Output this shift: Total I/O In: 0  Out: 18 [Drains:18]  Physical Exam: Awake alert, oriented to name, [facility], and December 2020.  Following commands.  Speech fluent.  EOMI.  Face symmetrical.  IVC drainage moderately pink in color.  Assessment/Plan: Continue IVC drainage.  Continue supportive care.   Hosie Spangle, MD 09/29/2019, 8:45 AM

## 2019-09-30 ENCOUNTER — Inpatient Hospital Stay (HOSPITAL_COMMUNITY): Payer: BC Managed Care – PPO

## 2019-09-30 LAB — BASIC METABOLIC PANEL
Anion gap: 10 (ref 5–15)
BUN: 6 mg/dL (ref 6–20)
CO2: 28 mmol/L (ref 22–32)
Calcium: 9.6 mg/dL (ref 8.9–10.3)
Chloride: 99 mmol/L (ref 98–111)
Creatinine, Ser: 0.75 mg/dL (ref 0.61–1.24)
GFR calc Af Amer: >60 mL/min (ref >60)
GFR calc non Af Amer: >60 mL/min (ref >60)
Glucose, Bld: 121 mg/dL — ABNORMAL HIGH (ref 70–99)
Potassium: 4 mmol/L (ref 3.5–5.1)
Sodium: 137 mmol/L (ref 135–145)

## 2019-09-30 MED ORDER — MIDAZOLAM HCL 2 MG/2ML IJ SOLN
INTRAMUSCULAR | Status: AC
  Administered 2019-09-30: 2 mg via INTRAVENOUS
  Filled 2019-09-30: qty 2

## 2019-09-30 MED ORDER — MORPHINE SULFATE (PF) 4 MG/ML IV SOLN: 5.0000 mg | Freq: Once | INTRAVENOUS | Status: AC

## 2019-09-30 MED ORDER — DIAZEPAM 5 MG PO TABS
5.0000 mg | ORAL_TABLET | Freq: Four times a day (QID) | ORAL | Status: DC | PRN
  Administered 2019-10-01 – 2019-10-07 (×13): 5 mg via ORAL
  Filled 2019-09-30 (×13): qty 1

## 2019-09-30 MED ORDER — HYDROXYZINE HCL 50 MG/ML IM SOLN
50.0000 mg | INTRAMUSCULAR | Status: DC | PRN
  Administered 2019-09-30 – 2019-10-09 (×4): 50 mg via INTRAMUSCULAR
  Filled 2019-09-30 (×6): qty 1

## 2019-09-30 MED ORDER — MORPHINE SULFATE (PF) 4 MG/ML IV SOLN
INTRAVENOUS | Status: AC
  Administered 2019-09-30: 5 mg via INTRAVENOUS
  Filled 2019-09-30: qty 1

## 2019-09-30 MED ORDER — MORPHINE SULFATE (PF) 2 MG/ML IV SOLN
INTRAVENOUS | Status: AC
  Filled 2019-09-30: qty 1

## 2019-09-30 MED ORDER — MIDAZOLAM HCL 2 MG/2ML IJ SOLN: 2.0000 mg | Freq: Once | INTRAMUSCULAR | Status: AC

## 2019-09-30 NOTE — Plan of Care (Signed)
  Problem: Elimination: Goal: Will not experience complications related to bowel motility Outcome: Not Progressing   

## 2019-09-30 NOTE — Progress Notes (Signed)
Subjective: Patient with continued nausea.  CT of brain without contrast shows persistent ventriculomegaly, also small amount of blood along the right frontal IVC track.  Bmet this morning unremarkable.  Objective: Vital signs in last 24 hours: Vitals:   09/30/19 0100 09/30/19 0200 09/30/19 0300 09/30/19 0400  BP: (!) 112/50 (!) 98/53  137/68  Pulse: (!) 58 (!) 57 77 84  Resp: 16 17 19 13   Temp:    98.7 F (37.1 C)  TempSrc:    Oral  SpO2: 98% 96% 96% 96%  Weight:        Intake/Output from previous day: 12/24 0701 - 12/25 0700 In: 81 [P.O.:952] Out: 656 [Urine:500; Drains:156] Intake/Output this shift: Total I/O In: -  Out: 452 [Urine:450; Drains:2]  Physical Exam: Using sterile technique the distal aspect of the drain was flushed with sterile saline.  No increase in CSF flow.  Attempt to gently aspirate proximal aspect of drain and attempt to gently flush with sterile saline proximal aspect of drain unsuccessful.  BMET Recent Labs    09/30/19 0820  NA 137  K 4.0  CL 99  CO2 28  GLUCOSE 121*  BUN 6  CREATININE 0.75  CALCIUM 9.6    Studies/Results: CT HEAD WO CONTRAST  Result Date: 09/30/2019 CLINICAL DATA:  Sudden onset of nausea vomiting this morning. Status post craniotomy with drain placement. EXAM: CT HEAD WITHOUT CONTRAST TECHNIQUE: Contiguous axial images were obtained from the base of the skull through the vertex without intravenous contrast. COMPARISON:  September 25, 2019 FINDINGS: Brain: There is interval developed right frontal lobe intraparenchymal hemorrhage along the tract of the right frontal shunt tube, measuring 2 x 0.8 cm. The previously noted small volume hemorrhage in the occipital horn of bilateral lateral ventricles and third ventricle are unchanged. The right frontal shunt tube is identified with the tip in the anterior horn of the right lateral ventricle. There is hydrocephalus unchanged compared to prior exam. Small volumes pneumocephalus over  the cerebral convexities decreased compared prior exam. Vascular: No hyperdense vessel is noted. Skull: Status post anterior vertex craniotomy with postsurgical changes decreased compared prior exam. Sinuses/Orbits: No acute findings. Other: None. IMPRESSION: 1. Interval developed right frontal lobe intraparenchymal hemorrhage along the tract of the right frontal shunt tube, measuring 2 x 0.8 cm. The previously noted small volume hemorrhage in the occipital horn of bilateral lateral ventricles and third ventricle are unchanged. 2. Right frontal shunt tube is identified with the tip in the anterior horn of right lateral ventricle. There is hydrocephalus unchanged compared to prior exam. These results will be called to the ordering clinician or representative by the Radiologist Assistant, and communication documented in the PACS or zVision Dashboard. Electronically Signed   By: Abelardo Diesel M.D.   On: 09/30/2019 09:36    Assessment/Plan: IVC no longer functioning sufficiently.  Situation discussed with Dr. Christella Noa who is on-call today.  He will assess patient further and may need to place new IVC.  Hosie Spangle, MD 09/30/2019, 10:03 AM

## 2019-09-30 NOTE — Progress Notes (Signed)
Subjective: Called at 0600 because of repeated vomiting despite Zofran and Phenergan.  IVC at 20 cm of water with no drainage for several hours per RN.  IVC lowered to 10 cmH2O and patient ordered Vistaril 50 mg IM every 3 hours prn vomiting.  Nursing believes IVC drainage did increase some following lowering IVC, but no definitive documentation of extent of drainage.  Continuing to experience nausea despite lowering IVC and stroke.  Objective: Vital signs in last 24 hours: Vitals:   09/30/19 0100 09/30/19 0200 09/30/19 0300 09/30/19 0400  BP: (!) 112/50 (!) 98/53  137/68  Pulse: (!) 58 (!) 57 77 84  Resp: 16 17 19 13   Temp:    98.7 F (37.1 C)  TempSrc:    Oral  SpO2: 98% 96% 96% 96%  Weight:        Intake/Output from previous day: 12/24 0701 - 12/25 0700 In: 30 [P.O.:952] Out: 656 [Urine:500; Drains:156] Intake/Output this shift: Total I/O In: -  Out: 450 [Urine:450]  Physical Exam: Awake and alert, oriented, following commands, moving all 4 extremities well.  Assessment/Plan: Persistent nausea and vomiting, and decreased IVC output, will check CT brain without contrast and Bmet.  Hosie Spangle, MD 09/30/2019, 8:11 AM

## 2019-09-30 NOTE — Op Note (Signed)
   09/23/2019  PATIENT:   Tommy Austin  23 y.o. male   PRE-OPERATIVE DIAGNOSIS: obstructive hydrocephalus  POST-OPERATIVE DIAGNOSIS:  Same  PROCEDURE:  Right frontal ventricular catheter placement  SURGEON:  Shirlie Enck ANESTHESIA:   local DRAINS: Ventriculostomy Drain in the right lateral ventricle.   SPECIMEN: none  DICTATION: Tommy Austin had male  head shaved and then prepared in a sterile manner. I draped male  in a sterile manner. I injected lidocaine into the previous incision.  I used the hand twist drill to create a burr hole, as the previous burr hole was not optimal. I opened the dura with the 20 gauge spinal needle. I passed the ventricular catheter into the lateral ventricle. There was brisk flow of spinal fluid. I tunneled the catheter posteriorly and secured it to the skin at the exit. I approximated the scalp edges with nylon sutures. I placed a sterile dressing, then connected the catheter to the drainage system.   PLAN OF CARE: Continue ICU care  PATIENT DISPOSITION:  Remain in bed

## 2019-09-30 NOTE — Progress Notes (Signed)
Patient ID: Tommy Austin, male   DOB: [birth year], 23 y.o.   MRN: [id number] BP 114/61   Pulse 77   Temp 98.7 F (37.1 C) (Oral)   Resp 18   Wt 87.5 kg Comment: Last recorded weight.  CRNA to adjust in room.  SpO2 96%   BMI 24.77 kg/m  Lethargic, had been given iv dilaudid. Complaining of severe back pain and spasms.  Ventricular catheter replaced, draining well, fluid is blood tinged.  Moving all extremities Incision is clean, dry, without signs of infection.  Will observe for improvement with new ventricular catheter.

## 2019-10-01 NOTE — Progress Notes (Signed)
Subjective: The patient is alert and pleasant.  He is in no apparent distress.  Objective: Vital signs in last 24 hours: Temp:  [98.1 F (36.7 C)-100 F (37.8 C)] 100 F (37.8 C) (12/26 0800) Pulse Rate:  [57-104] 70 (12/26 0700) Resp:  [12-23] 16 (12/26 0700) BP: (97-140)/(50-72) 114/69 (12/26 0800) SpO2:  [95 %-100 %] 99 % (12/26 0700) Estimated body mass index is 24.77 kg/m as calculated from the following:   Height as of 05/11/17: 6\' 2"  (1.88 m).   Weight as of this encounter: 87.5 kg.   Intake/Output from previous day: 12/25 0701 - 12/26 0700 In: -  Out: 1892 [Urine:1600; Drains:292] Intake/Output this shift: Total I/O In: -  Out: 18 [Drains:18]  Physical exam the patient is alert and pleasant.  He is moving all 4 extremities.  He is oriented x3.  His ventriculostomy is patent and draining.  Lab Results: No results for input(s): WBC, HGB, HCT, PLT in the last 72 hours. BMET Recent Labs    09/30/19 0820  NA 137  K 4.0  CL 99  CO2 28  GLUCOSE 121*  BUN 6  CREATININE 0.75  CALCIUM 9.6    Studies/Results: CT HEAD WO CONTRAST  Result Date: 09/30/2019 CLINICAL DATA:  Sudden onset of nausea vomiting this morning. Status post craniotomy with drain placement. EXAM: CT HEAD WITHOUT CONTRAST TECHNIQUE: Contiguous axial images were obtained from the base of the skull through the vertex without intravenous contrast. COMPARISON:  September 25, 2019 FINDINGS: Brain: There is interval developed right frontal lobe intraparenchymal hemorrhage along the tract of the right frontal shunt tube, measuring 2 x 0.8 cm. The previously noted small volume hemorrhage in the occipital horn of bilateral lateral ventricles and third ventricle are unchanged. The right frontal shunt tube is identified with the tip in the anterior horn of the right lateral ventricle. There is hydrocephalus unchanged compared to prior exam. Small volumes pneumocephalus over the cerebral convexities decreased compared  prior exam. Vascular: No hyperdense vessel is noted. Skull: Status post anterior vertex craniotomy with postsurgical changes decreased compared prior exam. Sinuses/Orbits: No acute findings. Other: None. IMPRESSION: 1. Interval developed right frontal lobe intraparenchymal hemorrhage along the tract of the right frontal shunt tube, measuring 2 x 0.8 cm. The previously noted small volume hemorrhage in the occipital horn of bilateral lateral ventricles and third ventricle are unchanged. 2. Right frontal shunt tube is identified with the tip in the anterior horn of right lateral ventricle. There is hydrocephalus unchanged compared to prior exam. These results will be called to the ordering clinician or representative by the Radiologist Assistant, and communication documented in the PACS or zVision Dashboard. Electronically Signed   By: Abelardo Diesel M.D.   On: 09/30/2019 09:36    Assessment/Plan: Hydrocephalus, colloid cyst, status post craniotomy: The patient is stable clinically status post replacement of his ventriculostomy.  We will continue the ventriculostomy for now.  LOS: 9 days     Ophelia Charter 10/01/2019, 9:50 AM

## 2019-10-01 NOTE — Progress Notes (Signed)
Updated patient's mother at the bedside on the patient's plan of care. I have answered her questions regarding the patient's EVD and the possibility of VP shunt placement.

## 2019-10-02 NOTE — Progress Notes (Signed)
Subjective: Patient reports doing well.  Objective: Vital signs in last 24 hours: Temp:  [98.4 F (36.9 C)-101 F (38.3 C)] 99.1 F (37.3 C) (12/27 0400) Pulse Rate:  [61-101] 76 (12/27 0700) Resp:  [11-22] 17 (12/27 0700) BP: (107-132)/(55-80) 107/67 (12/27 0600) SpO2:  [96 %-100 %] 97 % (12/27 0700)  Intake/Output from previous day: 12/26 0701 - 12/27 0700 In: 750 [P.O.:750] Out: 849 [Urine:475; Drains:374] Intake/Output this shift: No intake/output data recorded.  Physical Exam: Stable exam.  IVC draining well.  Lab Results: No results for input(s): WBC, HGB, HCT, PLT in the last 72 hours. BMET Recent Labs    09/30/19 0820  NA 137  K 4.0  CL 99  CO2 28  GLUCOSE 121*  BUN 6  CREATININE 0.75  CALCIUM 9.6    Studies/Results: CT HEAD WO CONTRAST  Result Date: 09/30/2019 CLINICAL DATA:  Sudden onset of nausea vomiting this morning. Status post craniotomy with drain placement. EXAM: CT HEAD WITHOUT CONTRAST TECHNIQUE: Contiguous axial images were obtained from the base of the skull through the vertex without intravenous contrast. COMPARISON:  September 25, 2019 FINDINGS: Brain: There is interval developed right frontal lobe intraparenchymal hemorrhage along the tract of the right frontal shunt tube, measuring 2 x 0.8 cm. The previously noted small volume hemorrhage in the occipital horn of bilateral lateral ventricles and third ventricle are unchanged. The right frontal shunt tube is identified with the tip in the anterior horn of the right lateral ventricle. There is hydrocephalus unchanged compared to prior exam. Small volumes pneumocephalus over the cerebral convexities decreased compared prior exam. Vascular: No hyperdense vessel is noted. Skull: Status post anterior vertex craniotomy with postsurgical changes decreased compared prior exam. Sinuses/Orbits: No acute findings. Other: None. IMPRESSION: 1. Interval developed right frontal lobe intraparenchymal hemorrhage along  the tract of the right frontal shunt tube, measuring 2 x 0.8 cm. The previously noted small volume hemorrhage in the occipital horn of bilateral lateral ventricles and third ventricle are unchanged. 2. Right frontal shunt tube is identified with the tip in the anterior horn of right lateral ventricle. There is hydrocephalus unchanged compared to prior exam. These results will be called to the ordering clinician or representative by the Radiologist Assistant, and communication documented in the PACS or zVision Dashboard. Electronically Signed   By: Abelardo Diesel M.D.   On: 09/30/2019 09:36    Assessment/Plan: Patient is doing well.  Continue IVC drainage for now.  To decide on shunting this coming week.      LOS: 10 days    Peggyann Shoals, MD 10/02/2019, 8:16 AM

## 2019-10-03 NOTE — Progress Notes (Signed)
Patient seen today in Dr. Colleen Can absence.  Ventriculostomy is patent and draining 8 to 10 cc/h at 5 cm of water pressure.  He is awake and alert and pleasant and interactive.  He is moving everything appropriately.  I have spent considerable time answering his questions and his mother's questions.  I believe the plan is for ventriculoperitoneal shunting Thursday morning.  We will raise the drain to 20 cm today in hopes that he can start draining internally and avoid permanent shunting.  We will lower the drain back down if he becomes symptomatic.

## 2019-10-03 NOTE — Plan of Care (Signed)
  Problem: Clinical Measurements: Goal: Complications related to the disease process, condition or treatment will be avoided or minimized Outcome: Progressing   

## 2019-10-04 NOTE — Progress Notes (Signed)
Patient ID: Tommy Austin, male   DOB: 1996-08-12, 23 y.o.   MRN: [id number] BP 111/69   Pulse 95   Temp 99.2 F (37.3 C) (Oral)   Resp 13   Wt 87.5 kg Comment: Last recorded weight.  CRNA to adjust in room.  SpO2 99%   BMI 24.77 kg/m  Alert and oriented x 4, speech is clear, and fluent Perrl, full eom No drift  Tongue and uvula midline, symmetric facial movements Believes he vomited due to overeating. Will clamp ventricular catheter. Reports he felt better with it clamped.

## 2019-10-04 NOTE — Evaluation (Signed)
Physical Therapy Evaluation Patient Details Name: Tommy Austin MRN: [id number] DOB: 09-Jul-1996 Today's Date: 10/04/2019   History of Present Illness  23 yo male admitted to ED on 12/16 with migraines, tonic-clonic seizure observed by mother. CT head revealed moderate hydrocephalus, with MRI showing colloid cyst obstructing foramen of monro. Pt s/p craniotomy for resection of 3rd ventricular colloid cyst on 12/18, 12/20 replacement EVD due to progressive ventriculomegaly with blood in foramen of monro and pt lethargy. PMH includes ADHD.  Clinical Impression   Pt presents with generalized weakness suspect due to deconditioning, moderate head pain presenting as headache/pressure especially with mobility, mild unsteadiness in standing, and decreased activity tolerance due to head pressure and deconditioning. Pt to benefit from acute PT to address deficits. Pt ambulated 1 lap around unit today, complaining of mildly increased pressure in head after ~160 ft ambulation, RN notified. PT expects pt to progress well with mobility, no PT follow up indicated at this time after acute d/c. PT to progress mobility as tolerated, and will continue to follow acutely.       Follow Up Recommendations No PT follow up;Supervision for mobility/OOB    Equipment Recommendations  None recommended by PT    Recommendations for Other Services       Precautions / Restrictions Precautions Precautions: Fall;Other (comment) Precaution Comments: EVD - clamp when OOB, limit clamp to 10 minutes Restrictions Weight Bearing Restrictions: No      Mobility  Bed Mobility Overal bed mobility: Needs Assistance Bed Mobility: Supine to Sit;Sit to Supine     Supine to sit: Min assist Sit to supine: Min assist   General bed mobility comments: min assist for trunk elevation and lowering with HHA, increased time and effort.  Transfers Overall transfer level: Needs assistance Equipment used: 1 person hand held  assist Transfers: Sit to/from Stand Sit to Stand: Min assist         General transfer comment: min assist for power up and steadying, pt reaching for IV pole to steady self upon standing. PT with lines/leads management.  Ambulation/Gait Ambulation/Gait assistance: Min guard Gait Distance (Feet): 200 Feet Assistive device: IV Pole Gait Pattern/deviations: Step-through pattern;Decreased stride length;Shuffle;Trunk flexed Gait velocity: decr   General Gait Details: min guard for safety, verbal cuing for upright posture, increased foot clearance and hip flexion emphasized with marching. Pt with mild unsteadiness, pt-corrected with use of IV pole for steadying.  Stairs            Wheelchair Mobility    Modified Rankin (Stroke Patients Only)       Balance Overall balance assessment: Needs assistance Sitting-balance support: No upper extremity supported;Feet supported Sitting balance-Leahy Scale: Good     Standing balance support: Single extremity supported;During functional activity Standing balance-Leahy Scale: Fair                               Pertinent Vitals/Pain Pain Assessment: 0-10 Pain Score: 4  Pain Location: head Pain Descriptors / Indicators: Headache Pain Intervention(s): Limited activity within patient's tolerance;Monitored during session;Repositioned    Home Living Family/patient expects to be discharged to:: Private residence Living Arrangements: Parent Available Help at Discharge: Family Type of Home: House Home Access: Level entry     Home Layout: Two level;Bed/bath upstairs Home Equipment: None      Prior Function Level of Independence: Independent         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, works during the day  Hand Dominance   Dominant Hand: Right    Extremity/Trunk Assessment   Upper Extremity Assessment Upper Extremity Assessment: Generalized weakness    Lower Extremity Assessment Lower Extremity  Assessment: Generalized weakness    Cervical / Trunk Assessment Cervical / Trunk Assessment: Normal  Communication   Communication: No difficulties  Cognition Arousal/Alertness: Awake/alert Behavior During Therapy: Flat affect Overall Cognitive Status: Within Functional Limits for tasks assessed                                        General Comments      Exercises     Assessment/Plan    PT Assessment Patient needs continued PT services  PT Problem List Decreased strength;Decreased mobility;Decreased activity tolerance;Decreased balance;Pain       PT Treatment Interventions DME instruction;Therapeutic activities;Gait training;Therapeutic exercise;Patient/family education;Balance training;Stair training;Functional mobility training    PT Goals (Current goals can be found in the Care Plan section)  Acute Rehab PT Goals Patient Stated Goal: get stronger PT Goal Formulation: With patient Time For Goal Achievement: 10/18/19 Potential to Achieve Goals: Good    Frequency Min 3X/week   Barriers to discharge        Co-evaluation               AM-PAC PT "6 Clicks" Mobility  Outcome Measure Help needed turning from your back to your side while in a flat bed without using bedrails?: A Little Help needed moving from lying on your back to sitting on the side of a flat bed without using bedrails?: A Lot Help needed moving to and from a bed to a chair (including a wheelchair)?: A Little Help needed standing up from a chair using your arms (e.g., wheelchair or bedside chair)?: A Little Help needed to walk in hospital room?: A Little Help needed climbing 3-5 steps with a railing? : A Little 6 Click Score: 17    End of Session Equipment Utilized During Treatment: Gait belt Activity Tolerance: Patient tolerated treatment well;Patient limited by pain Patient left: in bed;with call bell/phone within reach;with SCD's reapplied Nurse Communication: Mobility  status;Other (comment)(EVD needs to be unclamped, pt reporting increased headache with ambulation) PT Visit Diagnosis: Muscle weakness (generalized) (M62.81);Unsteadiness on feet (R26.81);Pain Pain - part of body: (head)    Time: 5638-7564 PT Time Calculation (min) (ACUTE ONLY): 17 min   Charges:   PT Evaluation $PT Eval Low Complexity: 1 Low          Evelyne Makepeace E, PT Acute Rehabilitation Services Pager [phone]  Office [phone]   Avynn Klassen D Dekendrick Uzelac 10/04/2019, 10:10 AM

## 2019-10-04 NOTE — Progress Notes (Signed)
Paged Margo Aye, NP regarding pt c/o lower back pain and vomited x 1. Verbal order to lower drain to 10cmH2O.

## 2019-10-05 ENCOUNTER — Inpatient Hospital Stay (HOSPITAL_COMMUNITY): Payer: BC Managed Care – PPO

## 2019-10-05 MED ORDER — SCOPOLAMINE 1 MG/3DAYS TD PT72
1.0000 | MEDICATED_PATCH | TRANSDERMAL | Status: DC
  Administered 2019-10-05 – 2019-10-08 (×2): 1.5 mg via TRANSDERMAL
  Filled 2019-10-05 (×4): qty 1

## 2019-10-05 NOTE — Progress Notes (Signed)
Physical Therapy Treatment Patient Details Name: Tommy Austin MRN: [id number] DOB: [birth year] Today's Date: 10/05/2019    History of Present Illness 23 yo male admitted to ED on 12/16 with migraines, tonic-clonic seizure observed by mother. CT head revealed moderate hydrocephalus, with MRI showing colloid cyst obstructing foramen of monro. Pt s/p craniotomy for resection of 3rd ventricular colloid cyst on 12/18, 12/20 replacement EVD due to progressive ventriculomegaly with blood in foramen of monro and pt lethargy. PMH includes ADHD.    PT Comments    Pt agreeable to PT today, but states he is somewhat fatigued from ambulating hallway with RN this am. Pt tolerated short distance gait training with emphasis on challenging pt to ambulate without UE support, pt only tolerated short distance without support due to lightheadedness and pt comfort with HHA. PT initiated LE strengthening program with pt today, as pt continues to present with LE weakness. Pt also provided with gait belt to perform hamstring stretching in supine as pt lacking ~15* knee extension sitting EOB performing LAQs. Pt too fatigued to practice this, but is agreeable to perform later. PT to continue to follow acutely.    Follow Up Recommendations  No PT follow up;Supervision for mobility/OOB     Equipment Recommendations  None recommended by PT    Recommendations for Other Services       Precautions / Restrictions Precautions Precautions: Fall;Other (comment) Precaution Comments: EVD - clamp when OOB (currently on clamp trial) Restrictions Weight Bearing Restrictions: No    Mobility  Bed Mobility Overal bed mobility: Needs Assistance Bed Mobility: Supine to Sit;Sit to Supine     Supine to sit: Min assist Sit to supine: Min assist   General bed mobility comments: min assist for HHA when rising and sitting, increased time to rise and lack of eccentric control when sitting.  Transfers Overall transfer level:  Needs assistance Equipment used: 1 person hand held assist Transfers: Sit to/from Stand Sit to Stand: Min assist         General transfer comment: min assist for steadying  Ambulation/Gait Ambulation/Gait assistance: Min guard Gait Distance (Feet): 120 Feet Assistive device: IV Pole Gait Pattern/deviations: Step-through pattern;Decreased stride length;Shuffle;Trunk flexed Gait velocity: decr   General Gait Details: Min guard for safety, verbal cuing for increased foot clearance and upright posture. Pt reported feeling lightheaded, distance limited but BP WFL. Pt ambulated ~40 ft without UE assist, reached for it after this to steady self.   Stairs             Wheelchair Mobility    Modified Rankin (Stroke Patients Only)       Balance Overall balance assessment: Needs assistance Sitting-balance support: No upper extremity supported;Feet supported Sitting balance-Leahy Scale: Good     Standing balance support: Single extremity supported;During functional activity Standing balance-Leahy Scale: Fair                              Cognition Arousal/Alertness: Awake/alert Behavior During Therapy: Flat affect Overall Cognitive Status: Within Functional Limits for tasks assessed                                        Exercises General Exercises - Lower Extremity Short Arc Quad: AROM;Both;5 reps;Seated Toe Raises: AROM;Both;10 reps;Standing Mini-Sqauts: AAROM;Both;10 reps;Seated;Standing;Other (comment)(sit to stands from low bed height, with emphasis on slow eccentric lowering)  General Comments        Pertinent Vitals/Pain Pain Assessment: Faces Faces Pain Scale: Hurts a little bit Pain Location: head Pain Descriptors / Indicators: Headache;Other (Comment)(lightheaded feeling) Pain Intervention(s): Limited activity within patient's tolerance;Monitored during session    Home Living                      Prior Function             PT Goals (current goals can now be found in the care plan section) Acute Rehab PT Goals Patient Stated Goal: get stronger PT Goal Formulation: With patient Time For Goal Achievement: 10/18/19 Potential to Achieve Goals: Good Progress towards PT goals: Progressing toward goals    Frequency    Min 3X/week      PT Plan Current plan remains appropriate    Co-evaluation              AM-PAC PT "6 Clicks" Mobility   Outcome Measure  Help needed turning from your back to your side while in a flat bed without using bedrails?: A Little Help needed moving from lying on your back to sitting on the side of a flat bed without using bedrails?: A Little Help needed moving to and from a bed to a chair (including a wheelchair)?: A Little Help needed standing up from a chair using your arms (e.g., wheelchair or bedside chair)?: A Little Help needed to walk in hospital room?: A Little Help needed climbing 3-5 steps with a railing? : A Little 6 Click Score: 18    End of Session Equipment Utilized During Treatment: Gait belt Activity Tolerance: Patient tolerated treatment well;Patient limited by pain Patient left: in bed;with call bell/phone within reach;with SCD's reapplied;with family/visitor present Nurse Communication: Mobility status;Other (comment) PT Visit Diagnosis: Muscle weakness (generalized) (M62.81);Unsteadiness on feet (R26.81);Pain Pain - part of body: (head)     Time: 4097-3532 PT Time Calculation (min) (ACUTE ONLY): 30 min  Charges:  $Gait Training: 8-22 mins $Therapeutic Exercise: 8-22 mins                     Blannie Shedlock E, PT Acute Rehabilitation Services Pager [phone]  Office [phone]   Kelly Ranieri D Januel Doolan 10/05/2019, 1:56 PM

## 2019-10-05 NOTE — Progress Notes (Signed)
Neurosurgery Service Progress Note  Subjective: NAE ON, drain clamped yesterday afternoon, had some vomiting overnight that resolved w/ drain opening, but still states he feels nauseated  Objective: Vitals:   10/05/19 0500 10/05/19 0600 10/05/19 0700 10/05/19 0800  BP: 121/69 109/61 111/65 115/66  Pulse: 98 95 97 94  Resp: 16 15 14 12   Temp:      TempSrc:      SpO2: 98% 98% 96% 97%  Weight:       Temp (24hrs), Avg:99.3 F (37.4 C), Min:98.7 F (37.1 C), Max:100 F (37.8 C)  CBC Latest Ref Rng & Units 09/25/2019 09/22/2019 06/03/2017  WBC 4.0 - 10.5 K/uL 13.4(H) 16.6(H) 17.5(H)  Hemoglobin 13.0 - 17.0 g/dL 14.4 14.7 14.6  Hematocrit 39.0 - 52.0 % 40.7 43.2 42.8  Platelets 150 - 400 K/uL 227 245 229   BMP Latest Ref Rng & Units 09/30/2019 09/25/2019 09/22/2019  Glucose 70 - 99 mg/dL 121(H) 128(H) 108(H)  BUN 6 - 20 mg/dL 6 5(L) 12  Creatinine 0.61 - 1.24 mg/dL 0.75 0.72 0.86  Sodium 135 - 145 mmol/L 137 138 140  Potassium 3.5 - 5.1 mmol/L 4.0 3.3(L) 3.4(L)  Chloride 98 - 111 mmol/L 99 102 104  CO2 22 - 32 mmol/L 28 27 25   Calcium 8.9 - 10.3 mg/dL 9.6 9.3 9.1    Intake/Output Summary (Last 24 hours) at 10/05/2019 5188 Last data filed at 10/05/2019 0800 Gross per 24 hour  Intake 1057.55 ml  Output 1278 ml  Net -220.45 ml    Current Facility-Administered Medications:  .  0.9 %  sodium chloride infusion, , Intravenous, Continuous, Eustace Moore, MD, Last Rate: 50 mL/hr at 10/05/19 0800, Rate Verify at 10/05/19 0800 .  acetaminophen (TYLENOL) tablet 650 mg, 650 mg, Oral, Q6H PRN, 650 mg at 10/04/19 4166 **OR** acetaminophen (TYLENOL) suppository 650 mg, 650 mg, Rectal, Q6H PRN, Judith Part, MD .  bisacodyl (DULCOLAX) suppository 10 mg, 10 mg, Rectal, Daily PRN, Judith Part, MD .  Chlorhexidine Gluconate Cloth 2 % PADS 6 each, 6 each, Topical, Daily, Judith Part, MD, 6 each at 10/04/19 0800 .  diazepam (VALIUM) tablet 5 mg, 5 mg, Oral, Q6H PRN,  Ashok Pall, MD, 5 mg at 10/04/19 2054 .  docusate sodium (COLACE) capsule 100 mg, 100 mg, Oral, BID PRN, Costella, Vincent J, PA-C, 100 mg at 09/28/19 0926 .  docusate sodium (COLACE) capsule 100 mg, 100 mg, Oral, Daily, Jovita Gamma, MD, 100 mg at 10/04/19 0630 .  heparin injection 5,000 Units, 5,000 Units, Subcutaneous, Q8H, Judith Part, MD, 5,000 Units at 10/05/19 [phone] .  HYDROcodone-acetaminophen (NORCO/VICODIN) 5-325 MG per tablet 1-2 tablet, 1-2 tablet, Oral, Q4H PRN, Judith Part, MD, 1 tablet at 10/05/19 0100 .  HYDROmorphone (DILAUDID) injection 0.5 mg, 0.5 mg, Intravenous, Q2H PRN, Costella, Vincent J, PA-C, 0.5 mg at 10/05/19 0034 .  hydrOXYzine (ATARAX/VISTARIL) tablet 25 mg, 25 mg, Oral, Q6H PRN, Bergman, Meghan D, NP, 25 mg at 10/03/19 1728 .  hydrOXYzine (VISTARIL) injection 50 mg, 50 mg, Intramuscular, Q3H PRN, Jovita Gamma, MD, 50 mg at 10/01/19 1846 .  levETIRAcetam (KEPPRA) tablet 500 mg, 500 mg, Oral, BID, Judith Part, MD, 500 mg at 10/04/19 2220 .  magnesium citrate solution 1 Bottle, 1 Bottle, Oral, Once PRN, Costella, Vincent J, PA-C .  ondansetron (ZOFRAN) tablet 4 mg, 4 mg, Oral, Q6H PRN **OR** ondansetron (ZOFRAN) injection 4 mg, 4 mg, Intravenous, Q6H PRN, Judith Part, MD, 4 mg at 10/05/19  0030 .  oxyCODONE (Oxy IR/ROXICODONE) immediate release tablet 5-10 mg, 5-10 mg, Oral, Q3H PRN, Costella, Vincent J, PA-C, 10 mg at 10/05/19 0818 .  polyethylene glycol (MIRALAX / GLYCOLAX) packet 17 g, 17 g, Oral, Daily PRN, Costella, Vincent J, PA-C, 17 g at 10/04/19 7342 .  promethazine (PHENERGAN) injection 12.5-25 mg, 12.5-25 mg, Intravenous, Q6H PRN, Lisbeth Renshaw, MD, 25 mg at 10/05/19 0112   Physical Exam: AOx3, PERRL, EOMI, FS, Strength 5/5 x4, SILTx4, no drift  Assessment & Plan: 23 y.o. man with progressive headaches, nausea, and vomiting. CTH/MRI show colloid cyst with obstructive hydrocephalus. 12/18 s/p craniotomy for  colloid cyst resection, post-op CTH w/ decreased ventricular size, some resection bed IVH, 12/20 EVD replaced for hydrocephalus, 12/23 failed EVD clamp trial with severe headache and nausea, 12/25 EVD non-functioning, clinical sx of HCP, EVD replaced  -pt feels that nausea is not related to HCP, will clamp trial again today and get CTH to show ventricular size if his Sx returns -expect him to fail the clamp trial, OR tomorrow for R VPS if he fails -SCDs/TEDs, Rodrigo Ran  10/05/19 8:38 AM

## 2019-10-05 NOTE — Progress Notes (Addendum)
1715:  Patient vomited.  PRN Zofran given  Will take patient to CT scan per Dr. Zada Finders.    1800:  CT scan completed.  On call neurosurgery called and updated.  Per verbal order to keep EVD clamped.  RN to continue to monitor.

## 2019-10-05 NOTE — Progress Notes (Signed)
Patient having nausea and vomiting. Zofran given, followed by phenergan after repeat episode of vomiting. Patient still neuro intact. Drain was clamped today. Neurosurgery NP Meyran paged and updated. NP Meyran gave verbal orders to open drain and keep it at 10 cm water. Will continue to monitor.

## 2019-10-06 MED ORDER — LIDOCAINE HCL (PF) 1 % IJ SOLN
INTRAMUSCULAR | Status: AC
  Administered 2019-10-06: 5 mL
  Filled 2019-10-06: qty 5

## 2019-10-06 NOTE — Discharge Instructions (Signed)
Discharge Instructions  No restriction in activities, slowly increase your activity back to normal.   Your incision is closed with absorbable sutures. These will naturally fall off over the next 4-6 weeks. If they become bothersome or cause discomfort, apply some antibiotic ointment like bacitracin or neosporin on the sutures. This will soften them up and usually makes them more comfortable while they dissolve.  Okay to shower on the day of discharge. Be gentle when cleaning your incision. Use regular soap and water. If that is uncomfortable, try using baby shampoo. Do not submerge the wound under water for 2 weeks after surgery.  Follow up with Dr. Bradi Arbuthnot in 2 weeks after discharge. If you do not already have a discharge appointment, please call his office at [phone] to schedule a follow up appointment. If you have any concerns or questions, please call the office and let us know. 

## 2019-10-06 NOTE — Progress Notes (Signed)
Neurosurgery Service Progress Note  Subjective: NAE ON, clamped yesterday morning, had some emesis in the afternoon, CT showed decompressed vents, I transduced a pressure at bedside shortly afterwards and it was 11cm H2O.  Objective: Vitals:   10/06/19 0500 10/06/19 0700 10/06/19 0800 10/06/19 0832  BP: 112/64 (!) 99/59 115/74 120/74  Pulse: 90 73 94   Resp: 14 18 11 15   Temp:      TempSrc:      SpO2: 98% 96% 97%   Weight:       Temp (24hrs), Avg:98.7 F (37.1 C), Min:97.9 F (36.6 C), Max:99.5 F (37.5 C)  CBC Latest Ref Rng & Units 09/25/2019 09/22/2019 06/03/2017  WBC 4.0 - 10.5 K/uL 13.4(H) 16.6(H) 17.5(H)  Hemoglobin 13.0 - 17.0 g/dL 06/05/2017 63.3 35.4  Hematocrit 39.0 - 52.0 % 40.7 43.2 42.8  Platelets 150 - 400 K/uL 227 245 229   BMP Latest Ref Rng & Units 09/30/2019 09/25/2019 09/22/2019  Glucose 70 - 99 mg/dL 09/24/2019) 563(S) 937(D)  BUN 6 - 20 mg/dL 6 5(L) 12  Creatinine 428(J - 1.24 mg/dL 6.81 1.57 2.62  Sodium 135 - 145 mmol/L 137 138 140  Potassium 3.5 - 5.1 mmol/L 4.0 3.3(L) 3.4(L)  Chloride 98 - 111 mmol/L 99 102 104  CO2 22 - 32 mmol/L 28 27 25   Calcium 8.9 - 10.3 mg/dL 9.6 9.3 9.1    Intake/Output Summary (Last 24 hours) at 10/06/2019 0846 Last data filed at 10/06/2019 0800 Gross per 24 hour  Intake 967.2 ml  Output 1100 ml  Net -132.8 ml    Current Facility-Administered Medications:  .  0.9 %  sodium chloride infusion, , Intravenous, Continuous, 10/08/2019, MD, Stopped at 10/05/19 0820 .  acetaminophen (TYLENOL) tablet 650 mg, 650 mg, Oral, Q6H PRN, 650 mg at 10/04/19 10/07/19 **OR** acetaminophen (TYLENOL) suppository 650 mg, 650 mg, Rectal, Q6H PRN, 10/06/19, MD .  bisacodyl (DULCOLAX) suppository 10 mg, 10 mg, Rectal, Daily PRN, 5974, MD .  Chlorhexidine Gluconate Cloth 2 % PADS 6 each, 6 each, Topical, Daily, Jadene Pierini, MD, 6 each at 10/05/19 1730 .  diazepam (VALIUM) tablet 5 mg, 5 mg, Oral, Q6H PRN, Jadene Pierini,  MD, 5 mg at 10/06/19 0738 .  docusate sodium (COLACE) capsule 100 mg, 100 mg, Oral, BID PRN, Costella, Vincent J, PA-C, 100 mg at 09/28/19 0926 .  docusate sodium (COLACE) capsule 100 mg, 100 mg, Oral, Daily, 10/08/19, MD, 100 mg at 10/05/19 Shirlean Kelly .  heparin injection 5,000 Units, 5,000 Units, Subcutaneous, Q8H, 10/07/19, MD, 5,000 Units at 10/05/19 2233 .  HYDROcodone-acetaminophen (NORCO/VICODIN) 5-325 MG per tablet 1-2 tablet, 1-2 tablet, Oral, Q4H PRN, 10/07/19, MD, 2 tablet at 10/05/19 2234 .  HYDROmorphone (DILAUDID) injection 0.5 mg, 0.5 mg, Intravenous, Q2H PRN, Costella, Vincent J, PA-C, 0.5 mg at 10/05/19 1726 .  hydrOXYzine (ATARAX/VISTARIL) tablet 25 mg, 25 mg, Oral, Q6H PRN, Bergman, Meghan D, NP, 25 mg at 10/03/19 1728 .  hydrOXYzine (VISTARIL) injection 50 mg, 50 mg, Intramuscular, Q3H PRN, 10/07/19, MD, 50 mg at 10/01/19 1846 .  levETIRAcetam (KEPPRA) tablet 500 mg, 500 mg, Oral, BID, Shirlean Kelly, MD, 500 mg at 10/05/19 2233 .  ondansetron (ZOFRAN) tablet 4 mg, 4 mg, Oral, Q6H PRN **OR** ondansetron (ZOFRAN) injection 4 mg, 4 mg, Intravenous, Q6H PRN, 10/07/19, MD, 4 mg at 10/06/19 0738 .  oxyCODONE (Oxy IR/ROXICODONE) immediate release tablet 5-10 mg, 5-10 mg, Oral, Q3H PRN,  Costella, Vista Mink, PA-C, 10 mg at 10/06/19 6759 .  polyethylene glycol (MIRALAX / GLYCOLAX) packet 17 g, 17 g, Oral, Daily PRN, Costella, Vincent J, PA-C, 17 g at 10/04/19 1638 .  promethazine (PHENERGAN) injection 12.5-25 mg, 12.5-25 mg, Intravenous, Q6H PRN, Consuella Lose, MD, 25 mg at 10/05/19 0112 .  scopolamine (TRANSDERM-SCOP) 1 MG/3DAYS 1.5 mg, 1 patch, Transdermal, Q72H, Costella, Vincent J, PA-C, 1.5 mg at 10/05/19 1831   Physical Exam: AOx3, PERRL, EOMI, FS, Strength 5/5 x4, SILTx4, EVD in place draining serosang fluid  Assessment & Plan: 23 y.o. man with progressive headaches, nausea, and vomiting. CTH/MRI show colloid cyst with  obstructive hydrocephalus. 12/18 s/p craniotomy for colloid cyst resection, post-op CTH w/ decreased ventricular size, some resection bed IVH, 12/20 EVD replaced for hydrocephalus, 12/23 failed EVD clamp trial with severe headache and nausea, 12/25 EVD non-functioning, clinical sx of HCP, EVD replaced, intermittent nausea/vomiting not correlating w/ EVD clamp trials, 12/30 clamp trial w/ emesis x1, CTH decompressed vents, ICP of 12cm H2O.  -will d/c EVD this morning, thankfully should not require shunt placement -SCDs/TEDs, SQH -if he does well overnight w/o any further emesis, can discharge tomorrow morning  Judith Part  10/06/19 8:46 AM

## 2019-10-07 ENCOUNTER — Inpatient Hospital Stay (HOSPITAL_COMMUNITY): Payer: BC Managed Care – PPO

## 2019-10-07 NOTE — Progress Notes (Signed)
CT reviewed. Ventricles still appear decompressed. Will hold on lp Sleeping peacefully at this time.

## 2019-10-07 NOTE — Progress Notes (Signed)
Patient complaining of intense pressure in his head that was not present when the drain was in place. All PRN pain options have been given. Vital signs stable. Mom at bedside. MD notified. Tommy Austin

## 2019-10-07 NOTE — Progress Notes (Signed)
BP 122/65 (BP Location: Left Arm)   Pulse 99   Temp 99.6 F (37.6 C) (Oral)   Resp 17   Wt 87.5 kg Comment: Last recorded weight.  CRNA to adjust in room.  SpO2 96%   BMI 24.77 kg/m  Alert, oriented x 4, speech is clear and fluent Complaining of pressure in his head. Pressure measured yesterday, 12cm h2o As I explained to him repeating the scan is not truly useful. His statements regarding pain is what we are using to assess his needs. He reported previously that clamping the drain made him better. Today he says he feels pressure in his head, and that is making him hurt.  I relayed to him that I can do a lumbar puncture to measure his pressure, but no ventricular catheter is being placed.  He has normal strength in all extremities, cognition is seemingly normal, Perrl, full eom Symmetric facies, tongue and uvula midline I will recheck later today.

## 2019-10-08 LAB — CSF CELL COUNT WITH DIFFERENTIAL
Eosinophils, CSF: 0 % (ref 0–1)
Lymphs, CSF: 7 % — ABNORMAL LOW (ref 40–80)
Monocyte-Macrophage-Spinal Fluid: 19 % (ref 15–45)
RBC Count, CSF: 0 /mm3
Segmented Neutrophils-CSF: 74 % — ABNORMAL HIGH (ref 0–6)
WBC, CSF: 10762 /mm3 (ref 0–5)

## 2019-10-08 LAB — CBC
HCT: 37.5 % — ABNORMAL LOW (ref 39.0–52.0)
Hemoglobin: 13.2 g/dL (ref 13.0–17.0)
MCH: 31.1 pg (ref 26.0–34.0)
MCHC: 35.2 g/dL (ref 30.0–36.0)
MCV: 88.4 fL (ref 80.0–100.0)
Platelets: 338 10*3/uL (ref 150–400)
RBC: 4.24 MIL/uL (ref 4.22–5.81)
RDW: 11.6 % (ref 11.5–15.5)
WBC: 17.1 10*3/uL — ABNORMAL HIGH (ref 4.0–10.5)
nRBC: 0 % (ref 0.0–0.2)

## 2019-10-08 MED ORDER — LORAZEPAM 2 MG/ML IJ SOLN
2.0000 mg | Freq: Once | INTRAMUSCULAR | Status: AC
  Administered 2019-10-08: 2 mg via INTRAVENOUS

## 2019-10-08 MED ORDER — HYDROMORPHONE HCL 1 MG/ML IJ SOLN
INTRAMUSCULAR | Status: AC
  Filled 2019-10-08: qty 1

## 2019-10-08 MED ORDER — SODIUM CHLORIDE 0.9 % IV SOLN
2.0000 g | Freq: Two times a day (BID) | INTRAVENOUS | Status: AC
  Administered 2019-10-09 – 2019-10-15 (×15): 2 g via INTRAVENOUS
  Filled 2019-10-08 (×3): qty 2
  Filled 2019-10-08: qty 20
  Filled 2019-10-08: qty 2
  Filled 2019-10-08: qty 20
  Filled 2019-10-08 (×4): qty 2
  Filled 2019-10-08: qty 20
  Filled 2019-10-08 (×4): qty 2

## 2019-10-08 MED ORDER — VANCOMYCIN HCL 1500 MG/300ML IV SOLN
1500.0000 mg | Freq: Once | INTRAVENOUS | Status: AC
  Administered 2019-10-09: 1500 mg via INTRAVENOUS
  Filled 2019-10-08: qty 300

## 2019-10-08 MED ORDER — LORAZEPAM 2 MG/ML IJ SOLN
INTRAMUSCULAR | Status: AC
  Filled 2019-10-08: qty 1

## 2019-10-08 MED ORDER — HYDROMORPHONE HCL 1 MG/ML IJ SOLN
1.0000 mg | Freq: Once | INTRAMUSCULAR | Status: AC
  Administered 2019-10-08: 1 mg via INTRAVENOUS

## 2019-10-08 MED ORDER — HYDROMORPHONE HCL 1 MG/ML IJ SOLN
0.5000 mg | INTRAMUSCULAR | Status: DC | PRN
  Administered 2019-10-08 – 2019-10-09 (×6): 0.5 mg via INTRAVENOUS
  Filled 2019-10-08 (×6): qty 1

## 2019-10-08 NOTE — Op Note (Signed)
BP (!) 110/58   Pulse 81   Temp 98.9 F (37.2 C) (Oral)   Resp 17   Wt 87.5 kg Comment: Last recorded weight.  CRNA to adjust in room.  SpO2 98%   BMI 24.77 kg/m  Tommy Austin received 1mg  dilaudid IV, 2mg  ativan. He was positioned in the recumbent position with left side up. Under sterile conditions a lumbar puncture was performed. Lumbar puncture Opening pressure 39 mm h20 Closing pressure 12 mm h2o Cloudy, golden Will send for cell count, diff, glucose/protein, gram stain

## 2019-10-08 NOTE — Progress Notes (Signed)
CRITICAL VALUE ALERT  Critical Value:  WBC Results for Tommy Austin, Tommy Austin (MRN [id number]) as of 10/08/2019 22:24  Ref. Range 10/08/2019 18:12  WBC, CSF Latest Ref Range: 0 - 5 /cu mm 10,762 [facility])    Date & Time Notied:  10/08/2019  2230  Provider Notified: Leo Grosser, NP on call  Orders Received/Actions taken: No orders given, will monitor

## 2019-10-08 NOTE — Progress Notes (Signed)
States he is doing better today than yesterday.  He answers questions appropriately and moves all extremities.  Incision clean dry and intact.EOMI, he does tend to doze off during conversation.  He is eating okay.  He is getting up to the bathroom okay.  Will transfer to the floor today.

## 2019-10-08 NOTE — Progress Notes (Signed)
Patient complaining of severe pain 10/10 that is not relieved by narcotic pain medication. Dr. Franky Macho spoke with patient and his mother (at bedside) and patient has decided to pursue a lumbar puncture.

## 2019-10-09 ENCOUNTER — Inpatient Hospital Stay (HOSPITAL_COMMUNITY): Payer: BC Managed Care – PPO

## 2019-10-09 LAB — BASIC METABOLIC PANEL
Anion gap: 8 (ref 5–15)
BUN: 6 mg/dL (ref 6–20)
CO2: 27 mmol/L (ref 22–32)
Calcium: 9.5 mg/dL (ref 8.9–10.3)
Chloride: 94 mmol/L — ABNORMAL LOW (ref 98–111)
Creatinine, Ser: 0.84 mg/dL (ref 0.61–1.24)
GFR calc Af Amer: >60 mL/min (ref >60)
GFR calc non Af Amer: >60 mL/min (ref >60)
Glucose, Bld: 143 mg/dL — ABNORMAL HIGH (ref 70–99)
Potassium: 4.1 mmol/L (ref 3.5–5.1)
Sodium: 129 mmol/L — ABNORMAL LOW (ref 135–145)

## 2019-10-09 LAB — PROTEIN AND GLUCOSE, CSF
Glucose, CSF: <20 mg/dL — CL (ref 40–70)
Total  Protein, CSF: 159 mg/dL — ABNORMAL HIGH (ref 15–45)

## 2019-10-09 MED ORDER — VANCOMYCIN HCL 1250 MG/250ML IV SOLN
1250.0000 mg | Freq: Three times a day (TID) | INTRAVENOUS | Status: DC
  Administered 2019-10-09 – 2019-10-11 (×6): 1250 mg via INTRAVENOUS
  Filled 2019-10-09 (×9): qty 250

## 2019-10-09 MED ORDER — HYDROMORPHONE HCL 1 MG/ML IJ SOLN
1.0000 mg | INTRAMUSCULAR | Status: DC | PRN
  Administered 2019-10-09 – 2019-10-11 (×7): 1 mg via INTRAVENOUS
  Filled 2019-10-09 (×7): qty 1

## 2019-10-09 NOTE — Progress Notes (Signed)
Pt having pain & difficulty urinating this am, bladder scan volume 728 mL.  I&O cath output of 700 mL.  Leo Grosser Neurosurgery notified.

## 2019-10-09 NOTE — Progress Notes (Addendum)
Pharmacy Antibiotic Note  Tommy Austin is a 24 y.o. male admitted on 09/22/2019 with seizures/obstructive hydrocephalus.  Pharmacy has been consulted for Vancomycin dosing for possible meningitis. WBC is elevated. Renal function ok. Pt febrile up to 102. CSF cloudy with large amount WBC.   Plan: Vancomycin 1250 mg IV q8h >>No AUC dosing with meningitis Ceftriaxone 2g IV q12h per MD Trend WBC, temp, renal function  F/U infectious work-up Drug levels as indicated   Weight: 192 lb 14.4 oz (87.5 kg)(Last recorded weight.  CRNA to adjust in room.)  Temp (24hrs), Avg:99.8 F (37.7 C), Min:98.2 F (36.8 C), Max:102 F (38.9 C)  Recent Labs  Lab 10/08/19 2336  WBC 17.1*  CREATININE 0.84    CrCl cannot be calculated (Unknown ideal weight.).    No Known Allergies  Abran Duke, PharmD, BCPS Clinical Pharmacist Phone: [phone]

## 2019-10-09 NOTE — Progress Notes (Signed)
Notified by Radiologist of follow-up CT results that were relayed to Leo Grosser, NP on call.  No new orders.  IMPRESSION: Interval increase in lateral ventriculomegaly as compared to examination 10/07/2019.  Similar appearance of hemorrhage along the right frontal approach surgical tract with surrounding edema.  A small amount of subarachnoid hemorrhage is now questioned within the prepontine cistern, which may be secondary to interval redistribution.

## 2019-10-09 NOTE — Progress Notes (Signed)
Subjective: Patient reports Overall patient doing okay continued headache but stable from yesterday.  Objective: Vital signs in last 24 hours: Temp:  [98.4 F (36.9 C)-102 F (38.9 C)] 99.5 F (37.5 C) (01/03 0400) Pulse Rate:  [81-91] 81 (01/03 0800) Resp:  [17-19] 19 (01/03 0800) BP: (102-132)/(49-76) 130/69 (01/03 0800) SpO2:  [96 %-98 %] 98 % (01/03 0800)  Intake/Output from previous day: 01/02 0701 - 01/03 0700 In: 596.3 [I.V.:164.8; IV Piggyback:431.4] Out: 700 [Urine:400; Emesis/NG output:300] Intake/Output this shift: Total I/O In: 50.1 [I.V.:50.1] Out: -   Awake and alert moves all extremities well  Lab Results: Recent Labs    10/08/19 2336  WBC 17.1*  HGB 13.2  HCT 37.5*  PLT 338   BMET Recent Labs    10/08/19 2336  NA 129*  K 4.1  CL 94*  CO2 27  GLUCOSE 143*  BUN 6  CREATININE 0.84  CALCIUM 9.5    Studies/Results: CT HEAD WO CONTRAST  Result Date: 10/09/2019 CLINICAL DATA:  Headache, acute, normal neuro exam EXAM: CT HEAD WITHOUT CONTRAST TECHNIQUE: Contiguous axial images were obtained from the base of the skull through the vertex without intravenous contrast. COMPARISON:  Head CT 10/07/2019, brain MRI 09/22/2019 FINDINGS: Brain: Similar appearance of hemorrhage along the right frontal approach surgical tract with surrounding edema. A small amount of subarachnoid hemorrhage is now questioned within the prepontine cistern, which may be secondary to redistribution. Mild-to-moderate lateral ventriculomegaly has increased since prior examination 10/07/2019. No midline shift. Vascular: No hyperdense vessel. Skull: Right frontal vertex cranioplasty.  Right frontal burr hole. Sinuses/Orbits: Visualized orbits demonstrate no acute abnormality. No significant paranasal sinus disease or mastoid effusion. These results will be called to the ordering clinician or representative by the Radiologist Assistant, and communication documented in the PACS or zVision  Dashboard. IMPRESSION: Interval increase in lateral ventriculomegaly as compared to examination 10/07/2019. Similar appearance of hemorrhage along the right frontal approach surgical tract with surrounding edema. A small amount of subarachnoid hemorrhage is now questioned within the prepontine cistern, which may be secondary to interval redistribution. Electronically Signed   By: Kellie Simmering DO   On: 10/09/2019 06:17   CT HEAD WO CONTRAST  Result Date: 10/07/2019 CLINICAL DATA:  Postoperative headache. Follow-up for hydrocephalus. Recent colloid cyst resection. EXAM: CT HEAD WITHOUT CONTRAST TECHNIQUE: Contiguous axial images were obtained from the base of the skull through the vertex without intravenous contrast. COMPARISON:  10/05/2019 FINDINGS: Brain: Ventriculostomy tube has been removed. No change in ventricular size. Hemorrhage along the surgical approach and ventriculostomy tract with surrounding edema does not show any increased bleeding. Blood shows expected evolutionary change, becoming less dense. No new finding. No extra-axial collection. Vascular: No new vascular finding. Skull: Right frontal vertex craniotomy as seen previously. Burr hole for ventriculostomy placement. Sinuses/Orbits: Clear/normal Other: None IMPRESSION: Ventriculostomy tube is removed.  No change in ventricular size. Expected evolutionary changes with diminishing density of blood along the ventriculostomy track. Regional edema, similar to the previous study. Electronically Signed   By: Nelson Chimes M.D.   On: 10/07/2019 15:27    Assessment/Plan: Status post colloid cyst removal had an external ventricular drain this was removed patient has had persistent headaches and CT scan does show slight enlargement of ventricular anatomy.  LP yesterday shows what looks like bacterial meningitis with significant high white count no red cells left shift and elevated protein.  Patient currently now on Vanco and Rocephin as patient seems to  be awake recommend continued IV antibiotics I  do not think that he needs replacement of external ventricular drain at this point.  If patient becomes more lethargic would consider this patient is not a shunt candidate with infection.  LOS: 17 days     Katha Kuehne P 10/09/2019, 8:36 AM

## 2019-10-10 NOTE — Progress Notes (Signed)
Physical Therapy Treatment Patient Details Name: Tommy Austin MRN: [id number] DOB: 1996/09/30 Today's Date: 10/10/2019    History of Present Illness 24 yo male admitted to ED on 12/16 with migraines, tonic-clonic seizure observed by mother. CT head revealed moderate hydrocephalus, with MRI showing colloid cyst obstructing foramen of monro. Pt s/p craniotomy for resection of 3rd ventricular colloid cyst on 12/18, 12/20 replacement EVD due to progressive ventriculomegaly with blood in foramen of monro and pt lethargy. PMH includes ADHD.    PT Comments    Pt progressing towards all goals however remains at increased falls risk as pt with several episodes of posterior LOB during ambulation and dynamic standing and scored a 16 on the DGI. Pt remains to have flat affect but also impulsive with decreased insight to deficits. Suspect pt to cont to progress and be safe to return home with family. Acute PT to cont to follow.   Follow Up Recommendations  No PT follow up;Supervision for mobility/OOB     Equipment Recommendations  None recommended by PT    Recommendations for Other Services       Precautions / Restrictions Precautions Precautions: Fall Restrictions Weight Bearing Restrictions: No    Mobility  Bed Mobility Overal bed mobility: Modified Independent Bed Mobility: Supine to Sit;Sit to Supine     Supine to sit: Modified independent (Device/Increase time) Sit to supine: Modified independent (Device/Increase time)   General bed mobility comments: pt disconnect monitor lines and brought self to eob using bedrail  Transfers Overall transfer level: Needs assistance Equipment used: None Transfers: Sit to/from Stand Sit to Stand: Min guard         General transfer comment: due to pts impulsivity min guard required for safety  Ambulation/Gait Ambulation/Gait assistance: Min guard;Min assist Gait Distance (Feet): 150 Feet Assistive device: None Gait Pattern/deviations:  Step-through pattern;Decreased stride length;Leaning posteriorly Gait velocity: wfl Gait velocity interpretation: >2.62 ft/sec, indicative of community ambulatory General Gait Details: pt with several episodes of posterior LOB especially with turning or moving to quickly requiring minA to maintain balance, pt also noted with difficutly clearing R foot consistently   Stairs Stairs: Yes Stairs assistance: Min guard Stair Management: One rail Left;Alternating pattern;Forwards Number of Stairs: 12 General stair comments: pt impulsively quick up the stairs requiring min guard and verbal cues to slow down   Wheelchair Mobility    Modified Rankin (Stroke Patients Only)       Balance Overall balance assessment: Needs assistance Sitting-balance support: No upper extremity supported;Feet supported Sitting balance-Leahy Scale: Good     Standing balance support: No upper extremity supported Standing balance-Leahy Scale: Fair Standing balance comment: pt stood in attempt to pee and was swaying anteriorly/posteriorly                 Standardized Balance Assessment Standardized Balance Assessment : Dynamic Gait Index   Dynamic Gait Index Level Surface: Mild Impairment Change in Gait Speed: Mild Impairment Gait with Horizontal Head Turns: Mild Impairment Gait with Vertical Head Turns: Mild Impairment Gait and Pivot Turn: Mild Impairment Step Over Obstacle: Mild Impairment Step Around Obstacles: Mild Impairment Steps: Mild Impairment Total Score: 16      Cognition Arousal/Alertness: Awake/alert Behavior During Therapy: Flat affect;Impulsive Overall Cognitive Status: Impaired/Different from baseline Area of Impairment: Safety/judgement                         Safety/Judgement: Decreased awareness of deficits     General Comments: pt quick to move  and undo his lines, pt with several posterior losses of balance causing him to reach for things to keep himself up,  pt reports "oh i'm good"      Exercises      General Comments General comments (skin integrity, edema, etc.): pt with no more EVD      Pertinent Vitals/Pain Pain Assessment: 0-10 Pain Score: 10-Worst pain ever Pain Location: low back Pain Descriptors / Indicators: Sharp Pain Intervention(s): Monitored during session;Patient requesting pain meds-RN notified    Home Living                      Prior Function            PT Goals (current goals can now be found in the care plan section) Progress towards PT goals: Progressing toward goals    Frequency    Min 2X/week      PT Plan Frequency needs to be updated    Co-evaluation              AM-PAC PT "6 Clicks" Mobility   Outcome Measure  Help needed turning from your back to your side while in a flat bed without using bedrails?: None Help needed moving from lying on your back to sitting on the side of a flat bed without using bedrails?: None Help needed moving to and from a bed to a chair (including a wheelchair)?: A Little Help needed standing up from a chair using your arms (e.g., wheelchair or bedside chair)?: A Little Help needed to walk in hospital room?: A Little Help needed climbing 3-5 steps with a railing? : A Little 6 Click Score: 20    End of Session Equipment Utilized During Treatment: Gait belt Activity Tolerance: Patient tolerated treatment well;Patient limited by pain Patient left: in bed;with call bell/phone within reach;with family/visitor present Nurse Communication: Mobility status;Patient requests pain meds PT Visit Diagnosis: Muscle weakness (generalized) (M62.81);Unsteadiness on feet (R26.81);Pain Pain - part of body: (back)     Time: 7824-2353 PT Time Calculation (min) (ACUTE ONLY): 19 min  Charges:  $Gait Training: 8-22 mins                     Kittie Plater, PT, DPT Acute Rehabilitation Services Pager #: [phone] Office #: [phone]    Berline Lopes 10/10/2019,  2:37 PM

## 2019-10-10 NOTE — Progress Notes (Signed)
Neurosurgery Service Progress Note  Subjective: Tommy Austin, still having some headache and nausea but both are less intense than prior to his LP over the weekend  Objective: Vitals:   10/10/19 0600 10/10/19 0700 10/10/19 0800 10/10/19 1021  BP:    117/65  Pulse:      Resp: 10 13  14   Temp:   98.2 F (36.8 C)   TempSrc:   Axillary   SpO2: 96%     Weight:       Temp (24hrs), Avg:98.5 F (36.9 C), Min:97.4 F (36.3 C), Max:99.7 F (37.6 C)  CBC Latest Ref Rng & Units 10/08/2019 09/25/2019 09/22/2019  WBC 4.0 - 10.5 K/uL 17.1(H) 13.4(H) 16.6(H)  Hemoglobin 13.0 - 17.0 g/dL 13.2 14.4 14.7  Hematocrit 39.0 - 52.0 % 37.5(L) 40.7 43.2  Platelets 150 - 400 K/uL 338 227 245   BMP Latest Ref Rng & Units 10/08/2019 09/30/2019 09/25/2019  Glucose 70 - 99 mg/dL 143(H) 121(H) 128(H)  BUN 6 - 20 mg/dL 6 6 5(L)  Creatinine 0.61 - 1.24 mg/dL 0.84 0.75 0.72  Sodium 135 - 145 mmol/L 129(L) 137 138  Potassium 3.5 - 5.1 mmol/L 4.1 4.0 3.3(L)  Chloride 98 - 111 mmol/L 94(L) 99 102  CO2 22 - 32 mmol/L 27 28 27   Calcium 8.9 - 10.3 mg/dL 9.5 9.6 9.3    Intake/Output Summary (Last 24 hours) at 10/10/2019 1044 Last data filed at 10/10/2019 1017 Gross per 24 hour  Intake 907.61 ml  Output 2450 ml  Net -1542.39 ml    Current Facility-Administered Medications:  .  0.9 %  sodium chloride infusion, , Intravenous, Continuous, Eustace Moore, MD, Stopped at 10/10/19 [phone] .  acetaminophen (TYLENOL) tablet 650 mg, 650 mg, Oral, Q6H PRN, 650 mg at 10/08/19 2149 **OR** acetaminophen (TYLENOL) suppository 650 mg, 650 mg, Rectal, Q6H PRN, Judith Part, MD .  bisacodyl (DULCOLAX) suppository 10 mg, 10 mg, Rectal, Daily PRN, Judith Part, MD .  cefTRIAXone (ROCEPHIN) 2 g in sodium chloride 0.9 % 100 mL IVPB, 2 g, Intravenous, Q12H, Eustace Moore, MD, Last Rate: 200 mL/hr at 10/10/19 1017, 2 g at 10/10/19 1017 .  Chlorhexidine Gluconate Cloth 2 % PADS 6 each, 6 each, Topical, Daily, Judith Part,  MD, 6 each at 10/09/19 1632 .  docusate sodium (COLACE) capsule 100 mg, 100 mg, Oral, Daily, Jovita Gamma, MD, 100 mg at 10/10/19 1018 .  heparin injection 5,000 Units, 5,000 Units, Subcutaneous, Q8H, Judith Part, MD, 5,000 Units at 10/10/19 0531 .  HYDROcodone-acetaminophen (NORCO/VICODIN) 5-325 MG per tablet 1-2 tablet, 1-2 tablet, Oral, Q4H PRN, Judith Part, MD, 2 tablet at 10/10/19 0755 .  HYDROmorphone (DILAUDID) injection 1 mg, 1 mg, Intravenous, Q4H PRN, Ashok Pall, MD, 1 mg at 10/10/19 0545 .  hydrOXYzine (ATARAX/VISTARIL) tablet 25 mg, 25 mg, Oral, Q6H PRN, Viona Gilmore D, NP, 25 mg at 10/10/19 0405 .  hydrOXYzine (VISTARIL) injection 50 mg, 50 mg, Intramuscular, Q3H PRN, Jovita Gamma, MD, 50 mg at 10/09/19 0519 .  levETIRAcetam (KEPPRA) tablet 500 mg, 500 mg, Oral, BID, Ashana Tullo, Joyice Faster, MD, 500 mg at 10/10/19 1023 .  ondansetron (ZOFRAN) tablet 4 mg, 4 mg, Oral, Q6H PRN, 4 mg at 10/09/19 1838 **OR** ondansetron (ZOFRAN) injection 4 mg, 4 mg, Intravenous, Q6H PRN, Judith Part, MD, 4 mg at 10/10/19 0804 .  oxyCODONE (Oxy IR/ROXICODONE) immediate release tablet 5-10 mg, 5-10 mg, Oral, Q3H PRN, Costella, Vincent J, PA-C, 10 mg at 10/10/19 0604 .  polyethylene glycol (MIRALAX / GLYCOLAX) packet 17 g, 17 g, Oral, Daily PRN, Costella, Vincent J, PA-C, 17 g at 10/09/19 1721 .  scopolamine (TRANSDERM-SCOP) 1 MG/3DAYS 1.5 mg, 1 patch, Transdermal, Q72H, Costella, Vincent J, PA-C, 1.5 mg at 10/08/19 1728 .  vancomycin (VANCOREADY) IVPB 1250 mg/250 mL, 1,250 mg, Intravenous, Q8H, Stevphen Rochester, RPH, Stopped at 10/10/19 3570   Physical Exam: AOx3, PERRL, EOMI, FS, Strength 5/5 x4, SILTx4  Assessment & Plan: 24 y.o. man with progressive headaches, nausea, and vomiting. CTH/MRI show colloid cyst with obstructive hydrocephalus. 12/18 s/p craniotomy for colloid cyst resection, post-op CTH w/ decreased ventricular size, some resection bed IVH, 12/20 EVD  replaced for hydrocephalus, 12/23 failed EVD clamp trial with severe headache and nausea, 12/25 EVD non-functioning, clinical sx of HCP, EVD replaced, intermittent nausea/vomiting not correlating w/ EVD clamp trials, 12/30 clamp trial w/ emesis x1, CTH decompressed vents, ICP of 12cm H2O. 1/2 LP for HA/vomiting; OP 39cm H2O, CSF #s c/f ventriculitis  -Day 2 of 7 of CTX + Vanc for presumed ventriculitis, Cx still NGTD but improving clinically -will need to keep in the unit until his inc'd ICP Sx improve, if they become severe again, will perform repeat LP to drain CSF and avoid having any hardware/implants in while infection is being treated -SCDs/TEDs, SQH -plan to keep in-house while Austin broad spectrum ABx and monitoring elevated ICPs, if his Sx resolve then he should be able to be discharged at the end of a 7d course of ABx  Jadene Pierini  10/10/19 10:44 AM

## 2019-10-11 LAB — BASIC METABOLIC PANEL
Anion gap: 9 (ref 5–15)
BUN: <5 mg/dL — ABNORMAL LOW (ref 6–20)
CO2: 29 mmol/L (ref 22–32)
Calcium: 9.3 mg/dL (ref 8.9–10.3)
Chloride: 96 mmol/L — ABNORMAL LOW (ref 98–111)
Creatinine, Ser: 0.74 mg/dL (ref 0.61–1.24)
GFR calc Af Amer: >60 mL/min (ref >60)
GFR calc non Af Amer: >60 mL/min (ref >60)
Glucose, Bld: 105 mg/dL — ABNORMAL HIGH (ref 70–99)
Potassium: 3.4 mmol/L — ABNORMAL LOW (ref 3.5–5.1)
Sodium: 134 mmol/L — ABNORMAL LOW (ref 135–145)

## 2019-10-11 LAB — PATHOLOGIST SMEAR REVIEW

## 2019-10-11 LAB — VANCOMYCIN, TROUGH: Vancomycin Tr: 10 ug/mL — ABNORMAL LOW (ref 15–20)

## 2019-10-11 MED ORDER — POTASSIUM CHLORIDE CRYS ER 20 MEQ PO TBCR
40.0000 meq | EXTENDED_RELEASE_TABLET | Freq: Once | ORAL | Status: AC
  Administered 2019-10-11: 40 meq via ORAL
  Filled 2019-10-11: qty 2

## 2019-10-11 MED ORDER — NYSTATIN 100000 UNIT/ML MT SUSP
5.0000 mL | Freq: Four times a day (QID) | OROMUCOSAL | Status: DC
  Administered 2019-10-11 – 2019-10-16 (×18): 500000 [IU] via ORAL
  Filled 2019-10-11 (×18): qty 5

## 2019-10-11 MED ORDER — VANCOMYCIN HCL 1500 MG/300ML IV SOLN
1500.0000 mg | Freq: Three times a day (TID) | INTRAVENOUS | Status: AC
  Administered 2019-10-11 – 2019-10-15 (×13): 1500 mg via INTRAVENOUS
  Filled 2019-10-11 (×13): qty 300

## 2019-10-11 MED ORDER — HYDROMORPHONE HCL 1 MG/ML IJ SOLN
1.0000 mg | Freq: Four times a day (QID) | INTRAMUSCULAR | Status: DC | PRN
  Administered 2019-10-12 (×3): 1 mg via INTRAVENOUS
  Filled 2019-10-11 (×3): qty 1

## 2019-10-11 NOTE — Progress Notes (Addendum)
Neurosurgery Service Progress Note  Subjective: NAE ON, still having some headache and nausea, but again improving, some photophobia and phonophobia, was up brushing his teeth in the bathroom when I saw him  Objective: Vitals:   10/11/19 0800 10/11/19 0900 10/11/19 1000 10/11/19 1100  BP:    121/67  Pulse: (!) 45 76 92 83  Resp: 10 16 (!) 22 16  Temp: 99 F (37.2 C)     TempSrc: Oral     SpO2: (!) 87% 98% 98% 97%  Weight:       Temp (24hrs), Avg:98.8 F (37.1 C), Min:98.4 F (36.9 C), Max:99 F (37.2 C)  CBC Latest Ref Rng & Units 10/08/2019 09/25/2019 09/22/2019  WBC 4.0 - 10.5 K/uL 17.1(H) 13.4(H) 16.6(H)  Hemoglobin 13.0 - 17.0 g/dL 13.2 14.4 14.7  Hematocrit 39.0 - 52.0 % 37.5(L) 40.7 43.2  Platelets 150 - 400 K/uL 338 227 245   BMP Latest Ref Rng & Units 10/11/2019 10/08/2019 09/30/2019  Glucose 70 - 99 mg/dL 105(H) 143(H) 121(H)  BUN 6 - 20 mg/dL <5(L) 6 6  Creatinine 0.61 - 1.24 mg/dL 0.74 0.84 0.75  Sodium 135 - 145 mmol/L 134(L) 129(L) 137  Potassium 3.5 - 5.1 mmol/L 3.4(L) 4.1 4.0  Chloride 98 - 111 mmol/L 96(L) 94(L) 99  CO2 22 - 32 mmol/L 29 27 28   Calcium 8.9 - 10.3 mg/dL 9.3 9.5 9.6    Intake/Output Summary (Last 24 hours) at 10/11/2019 1204 Last data filed at 10/11/2019 1100 Gross per 24 hour  Intake 1006.73 ml  Output 2650 ml  Net -1643.27 ml    Current Facility-Administered Medications:  .  0.9 %  sodium chloride infusion, , Intravenous, Continuous, Eustace Moore, MD, Stopped at 10/11/19 1045 .  acetaminophen (TYLENOL) tablet 650 mg, 650 mg, Oral, Q6H PRN, 650 mg at 10/08/19 2149 **OR** acetaminophen (TYLENOL) suppository 650 mg, 650 mg, Rectal, Q6H PRN, Judith Part, MD .  bisacodyl (DULCOLAX) suppository 10 mg, 10 mg, Rectal, Daily PRN, Judith Part, MD .  cefTRIAXone (ROCEPHIN) 2 g in sodium chloride 0.9 % 100 mL IVPB, 2 g, Intravenous, Q12H, Eustace Moore, MD, Stopped at 10/11/19 1027 .  Chlorhexidine Gluconate Cloth 2 % PADS 6 each, 6  each, Topical, Daily, Judith Part, MD, 6 each at 10/09/19 1632 .  docusate sodium (COLACE) capsule 100 mg, 100 mg, Oral, Daily, Jovita Gamma, MD, 100 mg at 10/11/19 0953 .  heparin injection 5,000 Units, 5,000 Units, Subcutaneous, Q8H, Judith Part, MD, 5,000 Units at 10/11/19 0511 .  HYDROcodone-acetaminophen (NORCO/VICODIN) 5-325 MG per tablet 1-2 tablet, 1-2 tablet, Oral, Q4H PRN, Judith Part, MD, 2 tablet at 10/11/19 [phone] .  HYDROmorphone (DILAUDID) injection 1 mg, 1 mg, Intravenous, Q4H PRN, Ashok Pall, MD, 1 mg at 10/11/19 0752 .  hydrOXYzine (ATARAX/VISTARIL) tablet 25 mg, 25 mg, Oral, Q6H PRN, Viona Gilmore D, NP, 25 mg at 10/10/19 0405 .  hydrOXYzine (VISTARIL) injection 50 mg, 50 mg, Intramuscular, Q3H PRN, Jovita Gamma, MD, 50 mg at 10/09/19 0519 .  levETIRAcetam (KEPPRA) tablet 500 mg, 500 mg, Oral, BID, Judith Part, MD, 500 mg at 10/11/19 0953 .  ondansetron (ZOFRAN) tablet 4 mg, 4 mg, Oral, Q6H PRN, 4 mg at 10/11/19 0356 **OR** ondansetron (ZOFRAN) injection 4 mg, 4 mg, Intravenous, Q6H PRN, Judith Part, MD, 4 mg at 10/10/19 0804 .  oxyCODONE (Oxy IR/ROXICODONE) immediate release tablet 5-10 mg, 5-10 mg, Oral, Q3H PRN, Costella, Vincent J, PA-C, 10 mg at 10/11/19  1142 .  polyethylene glycol (MIRALAX / GLYCOLAX) packet 17 g, 17 g, Oral, Daily PRN, Costella, Vincent J, PA-C, 17 g at 10/10/19 1826 .  scopolamine (TRANSDERM-SCOP) 1 MG/3DAYS 1.5 mg, 1 patch, Transdermal, Q72H, Costella, Vincent J, PA-C, 1.5 mg at 10/08/19 1728 .  vancomycin (VANCOREADY) IVPB 1250 mg/250 mL, 1,250 mg, Intravenous, Q8H, Stevphen Rochester, RPH, Last Rate: 166.7 mL/hr at 10/11/19 1145, 1,250 mg at 10/11/19 1145   Physical Exam: AOx3, PERRL, EOMI, FS, Strength 5/5 x4, SILTx4  Assessment & Plan: 24 y.o. man with progressive headaches, nausea, and vomiting. CTH/MRI show colloid cyst with obstructive hydrocephalus. 12/18 s/p craniotomy for colloid cyst resection,  post-op CTH w/ decreased ventricular size, some resection bed IVH, 12/20 EVD replaced for hydrocephalus, 12/23 failed EVD clamp trial with severe headache and nausea, 12/25 EVD non-functioning, clinical sx of HCP, EVD replaced, intermittent nausea/vomiting not correlating w/ EVD clamp trials, 12/30 clamp trial w/ emesis x1, CTH decompressed vents, ICP of 12cm H2O. 1/2 LP for HA/vomiting; OP 39cm H2O, CSF #s c/f ventriculitis  -Day 3 of 7 of CTX + Vanc for presumed ventriculitis, Cx still NGTD but improving clinically -vancT of 10, on 1250q8, dosing adjustment per pharmacy -will need to keep in the unit his inc'd ICP Sx improve, if they become severe again, will perform repeat LP to drain CSF and avoid having any hardware/implants in while infection is being treated -having some mild odynophagia with some white pharyngeal plaque, likely thrush 2/2 broad spectrum ABx, will start nystatin swish & spit -SCDs/TEDs, SQH -plan to keep in-house while on broad spectrum ABx and monitoring elevated ICPs, if his Sx resolve then he should be able to be discharged at the end of a 7d course of ABx  Jadene Pierini  10/11/19 12:04 PM

## 2019-10-11 NOTE — Progress Notes (Signed)
Pharmacy Antibiotic Note  Tommy Austin is a 24 y.o. male admitted on 09/22/2019 with seizures/obstructive hydrocephalus.  Pharmacy has been consulted for vancomycin dosing for presumed ventriculitis.  He is also on Rocephin.  Renal function is stable and vancomycin trough is sub-therapeutic at 10 mcg/mL (goal 15-20 mcg/mL).  However, true vancomycin trough is higher since lab was drawn 1.5 hours late.  Afebrile, WBC up to 17.1.  Plan: Increase vanc to 1500mg  IV Q8H for trough 15-20  mcg/mL CTX 2gm IV Q12H per MD Monitor renal fxn, clinical progress, PRN vanc trough Potentially 7 days total of abx per NS   Weight: 192 lb 14.4 oz (87.5 kg)(Last recorded weight.  CRNA to adjust in room.)  Temp (24hrs), Avg:98.8 F (37.1 C), Min:98.4 F (36.9 C), Max:99 F (37.2 C)  Recent Labs  Lab 10/08/19 2336 10/11/19 1107  WBC 17.1*  --   CREATININE 0.84 0.74  VANCOTROUGH  --  10*    CrCl cannot be calculated (Unknown ideal weight.).    No Known Allergies   Vanc 1/3 >> CTX 1/3 >>  1/5 VT = 10 mcg/mL (drawn 1.5 hrs late) >> incr to 1500mg  q8  12/18 MRSA PCR - negative 1/2 CSF - NGTD  Tommy Austin D. , PharmD, BCPS, BCCCP 10/11/2019, 12:28 PM

## 2019-10-12 LAB — BODY FLUID CULTURE: Culture: NO GROWTH

## 2019-10-12 LAB — BASIC METABOLIC PANEL
Anion gap: 11 (ref 5–15)
BUN: 6 mg/dL (ref 6–20)
CO2: 29 mmol/L (ref 22–32)
Calcium: 9.7 mg/dL (ref 8.9–10.3)
Chloride: 97 mmol/L — ABNORMAL LOW (ref 98–111)
Creatinine, Ser: 0.75 mg/dL (ref 0.61–1.24)
GFR calc Af Amer: >60 mL/min (ref >60)
GFR calc non Af Amer: >60 mL/min (ref >60)
Glucose, Bld: 97 mg/dL (ref 70–99)
Potassium: 4.1 mmol/L (ref 3.5–5.1)
Sodium: 137 mmol/L (ref 135–145)

## 2019-10-12 NOTE — Progress Notes (Signed)
Neurosurgery Service Progress Note  Subjective: NAE ON, no headaches or nausea this morning, ate breakfast without issue  Objective: Vitals:   10/12/19 0300 10/12/19 0400 10/12/19 0500 10/12/19 0600  BP: 122/73 99/62 98/60    Pulse: 65 65    Resp: 16 15 17 17   Temp:  98.3 F (36.8 C)    TempSrc:  Oral    SpO2: 98%     Weight:       Temp (24hrs), Avg:98.5 F (36.9 C), Min:98.1 F (36.7 C), Max:98.8 F (37.1 C)  CBC Latest Ref Rng & Units 10/08/2019 09/25/2019 09/22/2019  WBC 4.0 - 10.5 K/uL 17.1(H) 13.4(H) 16.6(H)  Hemoglobin 13.0 - 17.0 g/dL 09/24/2019 29.5 62.1  Hematocrit 39.0 - 52.0 % 37.5(L) 40.7 43.2  Platelets 150 - 400 K/uL 338 227 245   BMP Latest Ref Rng & Units 10/12/2019 10/11/2019 10/08/2019  Glucose 70 - 99 mg/dL 97 12/06/2019) 657(Q)  BUN 6 - 20 mg/dL 6 469(G) 6  Creatinine <2(X - 1.24 mg/dL 5.28 4.13 2.44  Sodium 135 - 145 mmol/L 137 134(L) 129(L)  Potassium 3.5 - 5.1 mmol/L 4.1 3.4(L) 4.1  Chloride 98 - 111 mmol/L 97(L) 96(L) 94(L)  CO2 22 - 32 mmol/L 29 29 27   Calcium 8.9 - 10.3 mg/dL 9.7 9.3 9.5    Intake/Output Summary (Last 24 hours) at 10/12/2019 Last data filed at 10/12/2019 0700 Gross per 24 hour  Intake 1065.04 ml  Output 500 ml  Net 565.04 ml    Current Facility-Administered Medications:  .  0.9 %  sodium chloride infusion, , Intravenous, Continuous, 2725, MD, Stopped at 10/11/19 1045 .  acetaminophen (TYLENOL) tablet 650 mg, 650 mg, Oral, Q6H PRN, 650 mg at 10/11/19 2348 **OR** acetaminophen (TYLENOL) suppository 650 mg, 650 mg, Rectal, Q6H PRN, 12/09/19, MD .  bisacodyl (DULCOLAX) suppository 10 mg, 10 mg, Rectal, Daily PRN, 2349, MD .  cefTRIAXone (ROCEPHIN) 2 g in sodium chloride 0.9 % 100 mL IVPB, 2 g, Intravenous, Q12H, Jadene Pierini, MD, Stopped at 10/11/19 2300 .  Chlorhexidine Gluconate Cloth 2 % PADS 6 each, 6 each, Topical, Daily, Tia Alert, MD, 6 each at 10/11/19 1804 .  docusate sodium (COLACE)  capsule 100 mg, 100 mg, Oral, Daily, Jadene Pierini, MD, 100 mg at 10/11/19 0953 .  heparin injection 5,000 Units, 5,000 Units, Subcutaneous, Q8H, Shirlean Kelly, MD, 5,000 Units at 10/12/19 0622 .  HYDROcodone-acetaminophen (NORCO/VICODIN) 5-325 MG per tablet 1-2 tablet, 1-2 tablet, Oral, Q4H PRN, Jadene Pierini, MD, 2 tablet at 10/11/19 2218 .  HYDROmorphone (DILAUDID) injection 1 mg, 1 mg, Intravenous, Q6H PRN, 12/09/19, MD, 1 mg at 10/12/19 0000 .  hydrOXYzine (ATARAX/VISTARIL) tablet 25 mg, 25 mg, Oral, Q6H PRN, Jadene Pierini, Meghan D, NP, 25 mg at 10/10/19 0405 .  hydrOXYzine (VISTARIL) injection 50 mg, 50 mg, Intramuscular, Q3H PRN, Doran Durand, MD, 50 mg at 10/09/19 0519 .  levETIRAcetam (KEPPRA) tablet 500 mg, 500 mg, Oral, BID, Shirlean Kelly, MD, 500 mg at 10/11/19 2218 .  nystatin (MYCOSTATIN) 100000 UNIT/ML suspension 500,000 Units, 5 mL, Oral, QID, 12/09/19, MD, 500,000 Units at 10/11/19 2218 .  ondansetron (ZOFRAN) tablet 4 mg, 4 mg, Oral, Q6H PRN, 4 mg at 10/11/19 0356 **OR** ondansetron (ZOFRAN) injection 4 mg, 4 mg, Intravenous, Q6H PRN, 2219, MD, 4 mg at 10/11/19 1353 .  polyethylene glycol (MIRALAX / GLYCOLAX) packet 17 g, 17 g, Oral, Daily PRN, Costella, Jadene Pierini,  PA-C, 17 g at 10/10/19 1826 .  scopolamine (TRANSDERM-SCOP) 1 MG/3DAYS 1.5 mg, 1 patch, Transdermal, Q72H, Costella, Vista Mink, PA-C, Stopped at 10/11/19 1800 .  vancomycin (VANCOREADY) IVPB 1500 mg/300 mL, 1,500 mg, Intravenous, Q8H, Tyrone Apple, RPH, Stopped at 10/12/19 4259   Physical Exam: AOx3, PERRL, EOMI, FS, Strength 5/5 x4, SILTx4  Assessment & Plan: 24 y.o. man with progressive headaches, nausea, and vomiting. CTH/MRI show colloid cyst with obstructive hydrocephalus. 12/18 s/p craniotomy for colloid cyst resection, post-op CTH w/ decreased ventricular size, some resection bed IVH, 12/20 EVD replaced for hydrocephalus, 12/23 failed EVD clamp trial with  severe headache and nausea, 12/25 EVD non-functioning, clinical sx of HCP, EVD replaced, intermittent nausea/vomiting not correlating w/ EVD clamp trials, 12/30 clamp trial w/ emesis x1, CTH decompressed vents, ICP of 12cm H2O. 1/2 LP for HA/vomiting; OP 39cm H2O, CSF #s c/f ventriculitis  -Day 4 of 7 of CTX + Vanc for presumed ventriculitis, Cx still NGTD but improving clinically -inc'd ICP Sx seem resolved, can transfer to stepdown -continue nystatin swish & spit while on Vanc/CTX -SCDs/TEDs, SQH -likely discharge this weekend if feeling well after finishing his course of IV ABx  Judith Part  10/12/19 8:08 AM

## 2019-10-13 LAB — VANCOMYCIN, TROUGH: Vancomycin Tr: 19 ug/mL (ref 15–20)

## 2019-10-13 LAB — BASIC METABOLIC PANEL
Anion gap: 13 (ref 5–15)
BUN: 7 mg/dL (ref 6–20)
CO2: 26 mmol/L (ref 22–32)
Calcium: 9.6 mg/dL (ref 8.9–10.3)
Chloride: 98 mmol/L (ref 98–111)
Creatinine, Ser: 0.71 mg/dL (ref 0.61–1.24)
GFR calc Af Amer: >60 mL/min (ref >60)
GFR calc non Af Amer: >60 mL/min (ref >60)
Glucose, Bld: 97 mg/dL (ref 70–99)
Potassium: 3.9 mmol/L (ref 3.5–5.1)
Sodium: 137 mmol/L (ref 135–145)

## 2019-10-13 NOTE — Progress Notes (Signed)
Physical Therapy Treatment Patient Details Name: Tommy Austin MRN: [id number] DOB: [birth year] Today's Date: 10/13/2019    History of Present Illness 24 yo male admitted to ED on 12/16 with migraines, tonic-clonic seizure observed by mother. CT head revealed moderate hydrocephalus, with MRI showing colloid cyst obstructing foramen of monro. Pt s/p craniotomy for resection of 3rd ventricular colloid cyst on 12/18, 12/20 replacement EVD due to progressive ventriculomegaly with blood in foramen of monro and pt lethargy. PMH includes ADHD.    PT Comments    Pt with improved ambulation distance and steadiness today, no LOB or posterior leaning noted as during last session. Pt and pt's mother educated on daily walking program for pt to increase activity tolerance, starting with short bouts ambulation around the house and progressing distance and time as able. Pt and mother are eager to d/c home, pt doing great with mobility.    Follow Up Recommendations  No PT follow up;Supervision for mobility/OOB     Equipment Recommendations  None recommended by PT    Recommendations for Other Services       Precautions / Restrictions Precautions Precautions: Fall Restrictions Weight Bearing Restrictions: No    Mobility  Bed Mobility Overal bed mobility: Modified Independent       Supine to sit: Modified independent (Device/Increase time) Sit to supine: Modified independent (Device/Increase time)   General bed mobility comments: Mod I for increased time and use of bed rails.  Transfers Overall transfer level: Modified independent Equipment used: None Transfers: Sit to/from Stand Sit to Stand: Modified independent (Device/Increase time)         General transfer comment: increased time to rise and steady, no physical assist required.  Ambulation/Gait Ambulation/Gait assistance: Supervision;Min guard Gait Distance (Feet): 300 Feet Assistive device: None Gait Pattern/deviations:  Step-through pattern;Decreased stride length Gait velocity: WFL   General Gait Details: supervision with occasional min guard for safety. Pt with no LOB or unsteadiness noted this session.   Stairs             Wheelchair Mobility    Modified Rankin (Stroke Patients Only)       Balance Overall balance assessment: Needs assistance Sitting-balance support: No upper extremity supported;Feet supported Sitting balance-Leahy Scale: Normal     Standing balance support: No upper extremity supported Standing balance-Leahy Scale: Good                              Cognition Arousal/Alertness: Awake/alert Behavior During Therapy: Flat affect;Impulsive Overall Cognitive Status: Impaired/Different from baseline Area of Impairment: Safety/judgement                         Safety/Judgement: Decreased awareness of deficits            Exercises Other Exercises Other Exercises: Instructed in posterior pelvic tilts for LBP, pt performed x5    General Comments        Pertinent Vitals/Pain Pain Assessment: Faces Faces Pain Scale: Hurts little more Pain Location: low back Pain Descriptors / Indicators: Sore;Discomfort;Throbbing Pain Intervention(s): Limited activity within patient's tolerance;Monitored during session;Repositioned    Home Living                      Prior Function            PT Goals (current goals can now be found in the care plan section) Acute Rehab PT Goals Patient Stated Goal: get  stronger PT Goal Formulation: With patient Time For Goal Achievement: 10/18/19 Potential to Achieve Goals: Good Progress towards PT goals: Progressing toward goals    Frequency    Min 2X/week      PT Plan Current plan remains appropriate    Co-evaluation              AM-PAC PT "6 Clicks" Mobility   Outcome Measure  Help needed turning from your back to your side while in a flat bed without using bedrails?: None Help  needed moving from lying on your back to sitting on the side of a flat bed without using bedrails?: None Help needed moving to and from a bed to a chair (including a wheelchair)?: None Help needed standing up from a chair using your arms (e.g., wheelchair or bedside chair)?: None Help needed to walk in hospital room?: None Help needed climbing 3-5 steps with a railing? : A Little 6 Click Score: 23    End of Session   Activity Tolerance: Patient tolerated treatment well Patient left: in bed;with call bell/phone within reach;with family/visitor present Nurse Communication: Mobility status PT Visit Diagnosis: Muscle weakness (generalized) (M62.81);Unsteadiness on feet (R26.81);Pain Pain - part of body: (back)     Time: 4268-3419 PT Time Calculation (min) (ACUTE ONLY): 9 min  Charges:  $Gait Training: 8-22 mins                     Weslee Fogg E, PT Acute Rehabilitation Services Pager [phone]  Office [phone]   Tempestt Silba D Sophia Sperry 10/13/2019, 3:09 PM

## 2019-10-13 NOTE — Progress Notes (Signed)
Neurosurgery Service Progress Note  Subjective: NAE ON, no headaches or nausea this morning, just feels tired but not excessively  Objective: Vitals:   10/12/19 2048 10/12/19 2354 10/13/19 0420 10/13/19 0739  BP: 130/63 (!) 122/46 (!) 107/57 (!) 102/51  Pulse: 80 81 73 78  Resp: 16   18  Temp: 98.7 F (37.1 C) 98.8 F (37.1 C) 99.2 F (37.3 C) 98.5 F (36.9 C)  TempSrc: Oral  Oral Oral  SpO2: 98%  100% 98%  Weight:       Temp (24hrs), Avg:98.7 F (37.1 C), Min:98.2 F (36.8 C), Max:99.2 F (37.3 C)  CBC Latest Ref Rng & Units 10/08/2019 09/25/2019 09/22/2019  WBC 4.0 - 10.5 K/uL 17.1(H) 13.4(H) 16.6(H)  Hemoglobin 13.0 - 17.0 g/dL 13.2 14.4 14.7  Hematocrit 39.0 - 52.0 % 37.5(L) 40.7 43.2  Platelets 150 - 400 K/uL 338 227 245   BMP Latest Ref Rng & Units 10/12/2019 10/11/2019 10/08/2019  Glucose 70 - 99 mg/dL 97 105(H) 143(H)  BUN 6 - 20 mg/dL 6 <5(L) 6  Creatinine 0.61 - 1.24 mg/dL 0.75 0.74 0.84  Sodium 135 - 145 mmol/L 137 134(L) 129(L)  Potassium 3.5 - 5.1 mmol/L 4.1 3.4(L) 4.1  Chloride 98 - 111 mmol/L 97(L) 96(L) 94(L)  CO2 22 - 32 mmol/L 29 29 27   Calcium 8.9 - 10.3 mg/dL 9.7 9.3 9.5    Intake/Output Summary (Last 24 hours) at 10/13/2019 6761 Last data filed at 10/13/2019 0300 Gross per 24 hour  Intake 2959.8 ml  Output --  Net 2959.8 ml    Current Facility-Administered Medications:  .  0.9 %  sodium chloride infusion, , Intravenous, Continuous, Eustace Moore, MD, Stopped at 10/11/19 1045 .  acetaminophen (TYLENOL) tablet 650 mg, 650 mg, Oral, Q6H PRN, 650 mg at 10/11/19 2348 **OR** acetaminophen (TYLENOL) suppository 650 mg, 650 mg, Rectal, Q6H PRN, Judith Part, MD .  bisacodyl (DULCOLAX) suppository 10 mg, 10 mg, Rectal, Daily PRN, Judith Part, MD .  cefTRIAXone (ROCEPHIN) 2 g in sodium chloride 0.9 % 100 mL IVPB, 2 g, Intravenous, Q12H, Eustace Moore, MD, Last Rate: 200 mL/hr at 10/12/19 2320, 2 g at 10/12/19 2320 .  Chlorhexidine Gluconate Cloth  2 % PADS 6 each, 6 each, Topical, Daily, Judith Part, MD, 6 each at 10/11/19 1804 .  docusate sodium (COLACE) capsule 100 mg, 100 mg, Oral, Daily, Jovita Gamma, MD, 100 mg at 10/12/19 0915 .  heparin injection 5,000 Units, 5,000 Units, Subcutaneous, Q8H, Judith Part, MD, 5,000 Units at 10/13/19 [phone] .  HYDROcodone-acetaminophen (NORCO/VICODIN) 5-325 MG per tablet 1-2 tablet, 1-2 tablet, Oral, Q4H PRN, Judith Part, MD, 2 tablet at 10/13/19 0604 .  HYDROmorphone (DILAUDID) injection 1 mg, 1 mg, Intravenous, Q6H PRN, Judith Part, MD, 1 mg at 10/12/19 1725 .  hydrOXYzine (ATARAX/VISTARIL) tablet 25 mg, 25 mg, Oral, Q6H PRN, Reinaldo Meeker, Meghan D, NP, 25 mg at 10/10/19 0405 .  hydrOXYzine (VISTARIL) injection 50 mg, 50 mg, Intramuscular, Q3H PRN, Jovita Gamma, MD, 50 mg at 10/09/19 0519 .  levETIRAcetam (KEPPRA) tablet 500 mg, 500 mg, Oral, BID, Judith Part, MD, 500 mg at 10/12/19 2220 .  nystatin (MYCOSTATIN) 100000 UNIT/ML suspension 500,000 Units, 5 mL, Oral, QID, Judith Part, MD, 500,000 Units at 10/12/19 2220 .  ondansetron (ZOFRAN) tablet 4 mg, 4 mg, Oral, Q6H PRN, 4 mg at 10/11/19 0356 **OR** ondansetron (ZOFRAN) injection 4 mg, 4 mg, Intravenous, Q6H PRN, Judith Part, MD, 4 mg  at 10/12/19 1454 .  polyethylene glycol (MIRALAX / GLYCOLAX) packet 17 g, 17 g, Oral, Daily PRN, Costella, Vincent J, PA-C, 17 g at 10/10/19 1826 .  scopolamine (TRANSDERM-SCOP) 1 MG/3DAYS 1.5 mg, 1 patch, Transdermal, Q72H, Costella, Darci Current, PA-C, Stopped at 10/11/19 1800 .  vancomycin (VANCOREADY) IVPB 1500 mg/300 mL, 1,500 mg, Intravenous, Q8H, Dang, Thuy D, RPH, Last Rate: 150 mL/hr at 10/13/19 0311, 1,500 mg at 10/13/19 2072   Physical Exam: AOx3, PERRL, EOMI, FS, Strength 5/5 x4, SILTx4 Oropharyngeal white plaques resolved  Assessment & Plan: 24 y.o. man with progressive headaches, nausea, and vomiting. CTH/MRI show colloid cyst with obstructive  hydrocephalus. 12/18 s/p craniotomy for colloid cyst resection, post-op CTH w/ decreased ventricular size, some resection bed IVH, 12/20 EVD replaced for hydrocephalus, 12/23 failed EVD clamp trial with severe headache and nausea, 12/25 EVD non-functioning, clinical sx of HCP, EVD replaced, intermittent nausea/vomiting not correlating w/ EVD clamp trials, 12/30 clamp trial w/ emesis x1, CTH decompressed vents, ICP of 12cm H2O. 1/2 LP for HA/vomiting; OP 39cm H2O, CSF #s c/f ventriculitis  -Day 5 of 7 of CTX + Vanc for presumed ventriculitis, Cx still NGTD but improving clinically -keep on stepdown -continue nystatin swish & spit while on Vanc/CTX -SCDs/TEDs, SQH -likely discharge this weekend if feeling well after finishing his course of IV ABx  Jadene Pierini  10/13/19 8:22 AM

## 2019-10-13 NOTE — Progress Notes (Signed)
Pharmacy Antibiotic Note  Tommy Austin is a 24 y.o. male admitted on 09/22/2019 with seizures/obstructive hydrocephalus.  Pharmacy has been consulted for vancomycin dosing for presumed ventriculitis.  He is also on Rocephin.  Renal function is stable and vancomycin trough is therapeutic at 19 mcg/mL (goal 15-20 mcg/mL).  Afebrile, WBC up to 17.1.  Plan: Continue vanc 1500mg  IV Q8H for trough 15-20 mcg/mL CTX 2gm IV Q12H per MD Monitor renal fxn, clinical progress, repeat vanc trough to r/o accumulation if LOT is extended beyond 10/14/18 Daily BMET until complete abx course   Weight: 192 lb 14.4 oz (87.5 kg)(Last recorded weight.  CRNA to adjust in room.)  Temp (24hrs), Avg:98.7 F (37.1 C), Min:98.2 F (36.8 C), Max:99.2 F (37.3 C)  Recent Labs  Lab 10/08/19 2336 10/11/19 1107 10/12/19 0355 10/13/19 0928  WBC 17.1*  --   --   --   CREATININE 0.84 0.74 0.75 0.71  VANCOTROUGH  --  10*  --  19    CrCl cannot be calculated (Unknown ideal weight.).    No Known Allergies  Vanc 1/3 >> CTX 1/3 >> Nystatin 1/5 >>  1/5 VT = 10 mcg/mL (drawn 1.5 hrs late) >> incr to 1500mg  q8 1/7 VT = 19 mcg/mL on 1500mg  q8 >> no change  12/18 MRSA PCR - negative 1/2 CSF - negative   Tommy Austin D. , PharmD, BCPS, BCCCP 10/13/2019, 10:53 AM

## 2019-10-14 LAB — BASIC METABOLIC PANEL
Anion gap: 10 (ref 5–15)
BUN: 8 mg/dL (ref 6–20)
CO2: 25 mmol/L (ref 22–32)
Calcium: 9.1 mg/dL (ref 8.9–10.3)
Chloride: 98 mmol/L (ref 98–111)
Creatinine, Ser: 0.92 mg/dL (ref 0.61–1.24)
GFR calc Af Amer: >60 mL/min (ref >60)
GFR calc non Af Amer: >60 mL/min (ref >60)
Glucose, Bld: 106 mg/dL — ABNORMAL HIGH (ref 70–99)
Potassium: 3.4 mmol/L — ABNORMAL LOW (ref 3.5–5.1)
Sodium: 133 mmol/L — ABNORMAL LOW (ref 135–145)

## 2019-10-14 MED ORDER — POTASSIUM CHLORIDE CRYS ER 20 MEQ PO TBCR
40.0000 meq | EXTENDED_RELEASE_TABLET | Freq: Two times a day (BID) | ORAL | Status: AC
  Administered 2019-10-14 (×2): 40 meq via ORAL
  Filled 2019-10-14 (×2): qty 2

## 2019-10-14 NOTE — Progress Notes (Addendum)
Neurosurgery Service Progress Note  Subjective: NAE ON, minimal headache, walking around his room in a bathrobe this morning  Objective: Vitals:   10/13/19 1542 10/13/19 2000 10/14/19 0022 10/14/19 0444  BP: 120/70 105/65 98/63 115/65  Pulse: 70 97 82 80  Resp: 18 18 18 16   Temp: 98.7 F (37.1 C) 98.2 F (36.8 C) 97.8 F (36.6 C) 98.6 F (37 C)  TempSrc: Oral Oral Oral Oral  SpO2: 100% 98% 98% 100%  Weight:       Temp (24hrs), Avg:98.6 F (37 C), Min:97.8 F (36.6 C), Max:99.5 F (37.5 C)  CBC Latest Ref Rng & Units 10/08/2019 09/25/2019 09/22/2019  WBC 4.0 - 10.5 K/uL 17.1(H) 13.4(H) 16.6(H)  Hemoglobin 13.0 - 17.0 g/dL 13.2 14.4 14.7  Hematocrit 39.0 - 52.0 % 37.5(L) 40.7 43.2  Platelets 150 - 400 K/uL 338 227 245   BMP Latest Ref Rng & Units 10/14/2019 10/13/2019 10/12/2019  Glucose 70 - 99 mg/dL 106(H) 97 97  BUN 6 - 20 mg/dL 8 7 6   Creatinine 0.61 - 1.24 mg/dL 0.92 0.71 0.75  Sodium 135 - 145 mmol/L 133(L) 137 137  Potassium 3.5 - 5.1 mmol/L 3.4(L) 3.9 4.1  Chloride 98 - 111 mmol/L 98 98 97(L)  CO2 22 - 32 mmol/L 25 26 29   Calcium 8.9 - 10.3 mg/dL 9.1 9.6 9.7    Intake/Output Summary (Last 24 hours) at 10/14/2019 0734 Last data filed at 10/13/2019 2200 Gross per 24 hour  Intake --  Output 850 ml  Net -850 ml    Current Facility-Administered Medications:  .  0.9 %  sodium chloride infusion, , Intravenous, Continuous, Eustace Moore, MD, Stopped at 10/11/19 1045 .  acetaminophen (TYLENOL) tablet 650 mg, 650 mg, Oral, Q6H PRN, 650 mg at 10/11/19 2348 **OR** acetaminophen (TYLENOL) suppository 650 mg, 650 mg, Rectal, Q6H PRN, Judith Part, MD .  bisacodyl (DULCOLAX) suppository 10 mg, 10 mg, Rectal, Daily PRN, Judith Part, MD .  cefTRIAXone (ROCEPHIN) 2 g in sodium chloride 0.9 % 100 mL IVPB, 2 g, Intravenous, Q12H, Eustace Moore, MD, Last Rate: 200 mL/hr at 10/13/19 2226, 2 g at 10/13/19 2226 .  Chlorhexidine Gluconate Cloth 2 % PADS 6 each, 6 each,  Topical, Daily, Judith Part, MD, 6 each at 10/11/19 1804 .  docusate sodium (COLACE) capsule 100 mg, 100 mg, Oral, Daily, Jovita Gamma, MD, 100 mg at 10/13/19 0958 .  heparin injection 5,000 Units, 5,000 Units, Subcutaneous, Q8H, Kaden Dunkel, Joyice Faster, MD, 5,000 Units at 10/14/19 0500 .  HYDROcodone-acetaminophen (NORCO/VICODIN) 5-325 MG per tablet 1-2 tablet, 1-2 tablet, Oral, Q4H PRN, Judith Part, MD, 2 tablet at 10/13/19 2217 .  HYDROmorphone (DILAUDID) injection 1 mg, 1 mg, Intravenous, Q6H PRN, Judith Part, MD, 1 mg at 10/12/19 1725 .  hydrOXYzine (ATARAX/VISTARIL) tablet 25 mg, 25 mg, Oral, Q6H PRN, Bergman, Meghan D, NP, 25 mg at 10/13/19 2013 .  hydrOXYzine (VISTARIL) injection 50 mg, 50 mg, Intramuscular, Q3H PRN, Jovita Gamma, MD, 50 mg at 10/09/19 0519 .  levETIRAcetam (KEPPRA) tablet 500 mg, 500 mg, Oral, BID, Judith Part, MD, 500 mg at 10/13/19 2218 .  nystatin (MYCOSTATIN) 100000 UNIT/ML suspension 500,000 Units, 5 mL, Oral, QID, Judith Part, MD, 500,000 Units at 10/13/19 2218 .  ondansetron (ZOFRAN) tablet 4 mg, 4 mg, Oral, Q6H PRN, 4 mg at 10/11/19 0356 **OR** ondansetron (ZOFRAN) injection 4 mg, 4 mg, Intravenous, Q6H PRN, Judith Part, MD, 4 mg at 10/13/19 0952 .  polyethylene glycol (MIRALAX / GLYCOLAX) packet 17 g, 17 g, Oral, Daily PRN, Costella, Vincent J, PA-C, 17 g at 10/10/19 1826 .  potassium chloride SA (KLOR-CON) CR tablet 40 mEq, 40 mEq, Oral, BID, Taniaya Rudder A, MD .  scopolamine (TRANSDERM-SCOP) 1 MG/3DAYS 1.5 mg, 1 patch, Transdermal, Q72H, Costella, Darci Current, PA-C, Stopped at 10/11/19 1800 .  vancomycin (VANCOREADY) IVPB 1500 mg/300 mL, 1,500 mg, Intravenous, Q8H, Dang, Thuy D, RPH, Last Rate: 150 mL/hr at 10/14/19 0438, 1,500 mg at 10/14/19 4709   Physical Exam: AOx3, PERRL, EOMI, FS, Strength 5/5 x4, SILTx4 Oropharyngeal white plaques resolved  Assessment & Plan: 24 y.o. man with progressive headaches,  nausea, and vomiting. CTH/MRI show colloid cyst with obstructive hydrocephalus. 12/18 s/p craniotomy for colloid cyst resection, post-op CTH w/ decreased ventricular size, some resection bed IVH, 12/20 EVD replaced for hydrocephalus, 12/23 failed EVD clamp trial with severe headache and nausea, 12/25 EVD non-functioning, clinical sx of HCP, EVD replaced, intermittent nausea/vomiting not correlating w/ EVD clamp trials, 12/30 clamp trial w/ emesis x1, CTH decompressed vents, ICP of 12cm H2O. 1/2 LP for HA/vomiting; OP 39cm H2O, CSF #s c/f ventriculitis  -Day 6 of 7 of CTX + Vanc for presumed ventriculitis, Cx still NGTD but improving clinically -floor status -continue nystatin swish & spit while on Vanc/CTX -SCDs/TEDs, SQH -vancT therapeutic, appreciate assistance from pharmacy, difficult task in a young person with such good renal function.  -hyponatremia, likely CNS-induced / CSW, should resolve without intervention -hypokalemia, K=3.4, will give po x2 -ABx done Saturday, possible discharge Sunday if feeling well  Tommy Austin  10/14/19 7:34 AM

## 2019-10-15 LAB — BASIC METABOLIC PANEL
Anion gap: 10 (ref 5–15)
BUN: 7 mg/dL (ref 6–20)
CO2: 26 mmol/L (ref 22–32)
Calcium: 9.6 mg/dL (ref 8.9–10.3)
Chloride: 102 mmol/L (ref 98–111)
Creatinine, Ser: 0.7 mg/dL (ref 0.61–1.24)
GFR calc Af Amer: >60 mL/min (ref >60)
GFR calc non Af Amer: >60 mL/min (ref >60)
Glucose, Bld: 92 mg/dL (ref 70–99)
Potassium: 4.2 mmol/L (ref 3.5–5.1)
Sodium: 138 mmol/L (ref 135–145)

## 2019-10-15 NOTE — Progress Notes (Signed)
  NEUROSURGERY PROGRESS NOTE   No issues overnight.  No HA. Eager for discharge  EXAM:  BP (!) 112/59   Pulse 91   Temp 98.3 F (36.8 C) (Oral)   Resp 20   Wt 87.5 kg Comment: Last recorded weight.  CRNA to adjust in room.  SpO2 99%   BMI 24.77 kg/m   Awake, alert, oriented  Speech fluent, appropriate  CN grossly intact  5/5 BUE/BLE  Incision c/d/i  PLAN Doing well Plan to complete day 7 of 7 vac/rocephin today and d/c tomorrow

## 2019-10-15 NOTE — Plan of Care (Signed)
  Problem: Education: Goal: Knowledge of General Education information will improve Description: Including pain rating scale, medication(s)/side effects and non-pharmacologic comfort measures Outcome: Progressing   Problem: Health Behavior/Discharge Planning: Goal: Ability to manage health-related needs will improve Outcome: Progressing   Problem: Clinical Measurements: Goal: Ability to maintain clinical measurements within normal limits will improve Outcome: Progressing Goal: Will remain free from infection Outcome: Progressing Goal: Diagnostic test results will improve Outcome: Progressing Goal: Respiratory complications will improve Outcome: Progressing   Problem: Activity: Goal: Risk for activity intolerance will decrease Outcome: Progressing   Problem: Nutrition: Goal: Adequate nutrition will be maintained Outcome: Progressing   Problem: Coping: Goal: Level of anxiety will decrease Outcome: Progressing   Problem: Elimination: Goal: Will not experience complications related to bowel motility Outcome: Progressing Goal: Will not experience complications related to urinary retention Outcome: Progressing   Problem: Pain Managment: Goal: General experience of comfort will improve Outcome: Progressing   Problem: Safety: Goal: Ability to remain free from injury will improve Outcome: Progressing   Problem: Education: Goal: Knowledge of the prescribed therapeutic regimen will improve Outcome: Progressing   Problem: Activity: Goal: Ability to tolerate increased activity will improve Outcome: Progressing   Problem: Nutrition: Goal: Maintenance of adequate nutrition will improve Outcome: Progressing   Problem: Clinical Measurements: Goal: Complications related to the disease process, condition or treatment will be avoided or minimized Outcome: Progressing   Problem: Skin Integrity: Goal: Demonstration of wound healing without infection will improve Outcome:  Progressing

## 2019-10-16 NOTE — Progress Notes (Signed)
Pt and mom given d/c education and all questions answered. No printed prescriptions to give or equipment to deliver. IV removed. Pt taken to car with all belongings.

## 2019-10-16 NOTE — Progress Notes (Signed)
  NEUROSURGERY PROGRESS NOTE   No issues overnight.  Feels great this am. No HA Up ambulating in halls Ready for d/c  EXAM:  BP 126/75 (BP Location: Left Arm)   Pulse (!) 101   Temp 98.5 F (36.9 C) (Oral)   Resp 20   Wt 87.5 kg Comment: Last recorded weight.  CRNA to adjust in room.  SpO2 99%   BMI 24.77 kg/m   Awake, alert, oriented  Speech fluent, appropriate  CN grossly intact  5/5 BUE/BLE  incnsion c/d/i  PLAN Doing well Completed 7 day course Vanc/rocephin for ventriculitis Cleared for d/c

## 2019-12-05 DIAGNOSIS — J039 Acute tonsillitis, unspecified: Secondary | ICD-10-CM | POA: Diagnosis not present

## 2019-12-05 DIAGNOSIS — Z03818 Encounter for observation for suspected exposure to other biological agents ruled out: Secondary | ICD-10-CM | POA: Diagnosis not present

## 2019-12-05 DIAGNOSIS — Z20828 Contact with and (suspected) exposure to other viral communicable diseases: Secondary | ICD-10-CM | POA: Diagnosis not present

## 2020-01-05 DIAGNOSIS — F909 Attention-deficit hyperactivity disorder, unspecified type: Secondary | ICD-10-CM | POA: Diagnosis not present

## 2020-03-22 DIAGNOSIS — F900 Attention-deficit hyperactivity disorder, predominantly inattentive type: Secondary | ICD-10-CM | POA: Diagnosis not present

## 2020-03-22 DIAGNOSIS — F411 Generalized anxiety disorder: Secondary | ICD-10-CM | POA: Diagnosis not present

## 2020-04-12 DIAGNOSIS — F411 Generalized anxiety disorder: Secondary | ICD-10-CM | POA: Diagnosis not present

## 2020-04-12 DIAGNOSIS — F900 Attention-deficit hyperactivity disorder, predominantly inattentive type: Secondary | ICD-10-CM | POA: Diagnosis not present

## 2020-05-16 DIAGNOSIS — F411 Generalized anxiety disorder: Secondary | ICD-10-CM | POA: Diagnosis not present

## 2020-05-16 DIAGNOSIS — F900 Attention-deficit hyperactivity disorder, predominantly inattentive type: Secondary | ICD-10-CM | POA: Diagnosis not present

## 2020-05-25 ENCOUNTER — Other Ambulatory Visit: Payer: Self-pay | Admitting: Neurological Surgery

## 2020-05-25 DIAGNOSIS — Q046 Congenital cerebral cysts: Secondary | ICD-10-CM

## 2020-05-31 ENCOUNTER — Ambulatory Visit
Admission: RE | Admit: 2020-05-31 | Discharge: 2020-05-31 | Disposition: A | Discharge status: Home / Self Care | Payer: BC Managed Care – PPO | Source: Ambulatory Visit | Attending: Neurological Surgery | Admitting: Neurological Surgery

## 2020-05-31 DIAGNOSIS — Z9889 Other specified postprocedural states: Secondary | ICD-10-CM | POA: Diagnosis not present

## 2020-05-31 DIAGNOSIS — Q046 Congenital cerebral cysts: Secondary | ICD-10-CM

## 2020-05-31 DIAGNOSIS — F988 Other specified behavioral and emotional disorders with onset usually occurring in childhood and adolescence: Secondary | ICD-10-CM | POA: Diagnosis not present

## 2020-05-31 DIAGNOSIS — R519 Headache, unspecified: Secondary | ICD-10-CM | POA: Diagnosis not present

## 2020-05-31 DIAGNOSIS — G9389 Other specified disorders of brain: Secondary | ICD-10-CM | POA: Diagnosis not present

## 2020-07-20 DIAGNOSIS — Q046 Congenital cerebral cysts: Secondary | ICD-10-CM | POA: Diagnosis not present

## 2020-08-02 DIAGNOSIS — F988 Other specified behavioral and emotional disorders with onset usually occurring in childhood and adolescence: Secondary | ICD-10-CM | POA: Diagnosis not present

## 2020-08-02 DIAGNOSIS — Z23 Encounter for immunization: Secondary | ICD-10-CM | POA: Diagnosis not present

## 2020-10-01 ENCOUNTER — Encounter (HOSPITAL_COMMUNITY): Payer: Self-pay | Admitting: Emergency Medicine

## 2020-10-01 ENCOUNTER — Other Ambulatory Visit: Payer: Self-pay

## 2020-10-01 ENCOUNTER — Ambulatory Visit (HOSPITAL_COMMUNITY)
Admission: EM | Admit: 2020-10-01 | Discharge: 2020-10-01 | Disposition: A | Discharge status: Home / Self Care | Payer: 59 | Attending: Family Medicine | Admitting: Family Medicine

## 2020-10-01 DIAGNOSIS — J069 Acute upper respiratory infection, unspecified: Secondary | ICD-10-CM | POA: Insufficient documentation

## 2020-10-01 DIAGNOSIS — U071 COVID-19: Secondary | ICD-10-CM | POA: Insufficient documentation

## 2020-10-01 DIAGNOSIS — R509 Fever, unspecified: Secondary | ICD-10-CM | POA: Diagnosis present

## 2020-10-01 NOTE — ED Triage Notes (Signed)
Pt c/o cough, body aches, fever x 2 days; took Tylenol cold medicine and Aleve pta.

## 2020-10-01 NOTE — ED Provider Notes (Signed)
[facility]    CSN: [id number] Arrival date & time: 10/01/20  1330      History   Chief Complaint Chief Complaint  Patient presents with  . Cough  . Generalized Body Aches  . Fever    HPI Tommy Austin is a 24 y.o. male.   Here today with 1-2 days of body aches, fever, chills, fatigue, cough, congestion, sore throat. Denies CP, SOB, HAs, abdominal pain, N/V/D. Has been taking otc cough and congestion medications and tylenol with temporary relief of sxs. Requesting COVID and Flu testing. No known sick contacts, chronic medical conditions.      Past Medical History:  Diagnosis Date  . ADHD   . Cystic acne     Patient Active Problem List   Diagnosis Date Noted  . Obstructive hydrocephalus (HCC) 09/22/2019  . ADHD 05/11/2017  . Cystic acne 05/11/2017  . MRSA cellulitis 05/11/2017    Past Surgical History:  Procedure Laterality Date  . APPLICATION OF CRANIAL NAVIGATION Right 09/23/2019   Procedure: APPLICATION OF CRANIAL NAVIGATION;  Surgeon: Jadene Pierini, MD;  Location: [facility] OR;  Service: Neurosurgery;  Laterality: Right;  . CRANIOTOMY Right 09/23/2019   Procedure: CRANIOTOMY TUMOR EXCISION;  Surgeon: Jadene Pierini, MD;  Location: [facility] OR;  Service: Neurosurgery;  Laterality: Right;  . HERNIA REPAIR         Home Medications    Prior to Admission medications   Medication Sig Start Date End Date Taking? Authorizing Provider  methylphenidate 18 MG PO CR tablet Take 18 mg by mouth every morning. 09/08/20  Yes [provider]  Ibuprofen-Acetaminophen (ADVIL DUAL ACTION) 125-250 MG TABS Take 1 tablet by mouth every 6 (six) hours as needed (pain).    [provider]  ondansetron (ZOFRAN-ODT) 4 MG disintegrating tablet Take 1 tablet (4 mg total) by mouth every 8 (eight) hours as needed for nausea or vomiting. 09/15/19   Mardella Layman, MD  prochlorperazine (COMPAZINE) 10 MG tablet Take 1 tablet (10 mg total) by mouth 2 (two) times  daily as needed for nausea or vomiting. 09/21/19   Tegeler, Canary Brim, MD  propranolol (INDERAL) 40 MG tablet Take 40 mg by mouth 2 (two) times daily. 09/19/19   [provider]  SUMAtriptan (IMITREX) 100 MG tablet Take 100 mg by mouth every 2 (two) hours as needed for migraine. Do not exceed 200 mg in 24 hours 09/19/19   [provider]    Family History Family History  Problem Relation Age of Onset  . Acne Mother     Social History Social History   Tobacco Use  . Smoking status: Never Smoker  . Smokeless tobacco: Never Used  Vaping Use  . Vaping Use: Never used  Substance Use Topics  . Alcohol use: No  . Drug use: No     Allergies   Patient has no known allergies.   Review of Systems Review of Systems PER HPI    Physical Exam Triage Vital Signs ED Triage Vitals  Enc Vitals Group     BP 10/01/20 1627 113/69     Pulse Rate 10/01/20 1627 90     Resp 10/01/20 1627 20     Temp 10/01/20 1627 98.7 F (37.1 C)     Temp Source 10/01/20 1627 Oral     SpO2 10/01/20 1627 99 %     Weight --      Height --      Head Circumference --  Peak Flow --      Pain Score 10/01/20 1623 0     Pain Loc --      Pain Edu? --      Excl. in GC? --    No data found.  Updated Vital Signs BP 113/69 (BP Location: Right Arm)   Pulse 90   Temp 98.7 F (37.1 C) (Oral)   Resp 20   SpO2 99%   Visual Acuity Right Eye Distance:   Left Eye Distance:   Bilateral Distance:    Right Eye Near:   Left Eye Near:    Bilateral Near:     Physical Exam Vitals and nursing note reviewed.  Constitutional:      Appearance: Normal appearance.  HENT:     Head: Atraumatic.     Nose: Rhinorrhea present.     Mouth/Throat:     Mouth: Mucous membranes are moist.     Pharynx: Oropharynx is clear. Posterior oropharyngeal erythema present. No oropharyngeal exudate.  Eyes:     Extraocular Movements: Extraocular movements intact.     Conjunctiva/sclera: Conjunctivae  normal.  Cardiovascular:     Rate and Rhythm: Normal rate and regular rhythm.     Heart sounds: Normal heart sounds.  Pulmonary:     Effort: Pulmonary effort is normal. No respiratory distress.     Breath sounds: Normal breath sounds. No wheezing or rales.  Abdominal:     General: Bowel sounds are normal. There is no distension.     Palpations: Abdomen is soft.     Tenderness: There is no abdominal tenderness. There is no guarding.  Musculoskeletal:        General: Normal range of motion.     Cervical back: Normal range of motion and neck supple.  Skin:    General: Skin is warm and dry.  Neurological:     General: No focal deficit present.     Mental Status: He is oriented to person, place, and time.  Psychiatric:        Mood and Affect: Mood normal.        Thought Content: Thought content normal.        Judgment: Judgment normal.      UC Treatments / Results  Labs (all labs ordered are listed, but only abnormal results are displayed) Labs Reviewed  RESP PANEL BY RT-PCR (FLU A&B, COVID) [id number]    EKG   Radiology No results found.  Procedures Procedures (including critical care time)  Medications Ordered in UC Medications - No data to display  Initial Impression / Assessment and Plan / UC Course  I have reviewed the triage vital signs and the nursing notes.  Pertinent labs & imaging results that were available during my care of the patient were reviewed by me and considered in my medical decision making (see chart for details).     Resp panel pending, discussed supportive OTC care and medications. Return precautions and isolation reviewed. Work note given.    Final Clinical Impressions(s) / UC Diagnoses   Final diagnoses:  Viral URI with cough  Fever, unspecified fever cause   Discharge Instructions   None    ED Prescriptions    None     PDMP not reviewed this encounter.   Particia Nearing, [place] 10/01/20 1705

## 2020-10-02 LAB — RESP PANEL BY RT-PCR (FLU A&B, COVID) ARPGX2
Influenza A by PCR: NEGATIVE
Influenza B by PCR: NEGATIVE
SARS Coronavirus 2 by RT PCR: POSITIVE — AB

## 2021-09-30 IMAGING — CT CT HEAD W/O CM
4 series · 16 of 47 positions shown, 18 images · non-contrast
Comparison: Head CT 10/07/2019, brain MRI 09/22/2019

CLINICAL DATA: Headache, acute, normal neuro exam

EXAM:
CT HEAD WITHOUT CONTRAST
TECHNIQUE: Contiguous axial images were obtained from the base of the skull
through the vertex without intravenous contrast.

[Series 3: head bone · axial · 0.47mm/px · z∈[-138,-102]mm · 3 of 94 slices shown]
[im 10/94  bone]
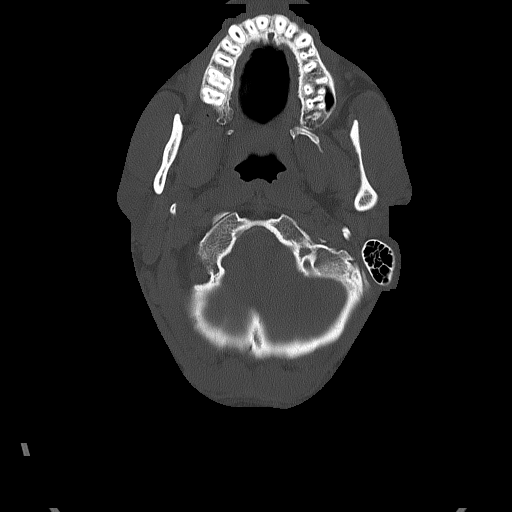
[im 19/94  bone]
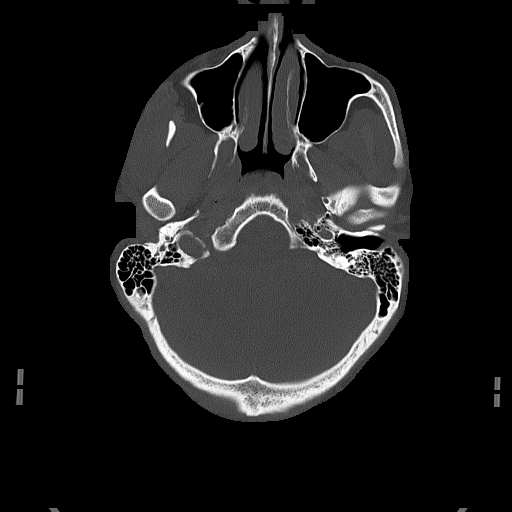
[im 28/94  bone]
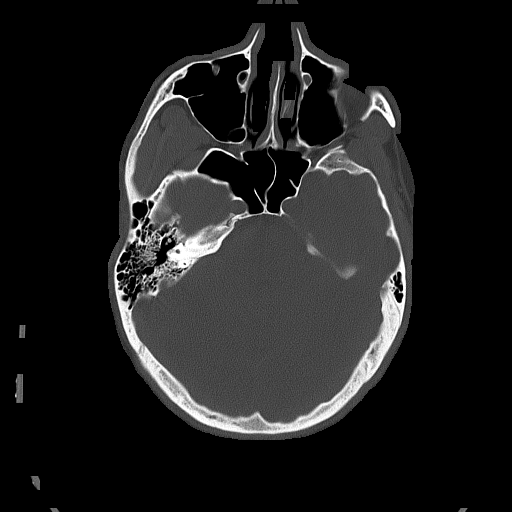

[Series 4: head without · axial · non-contrast · 0.47mm/px · z∈[-136,+4]mm · 7 of 38 slices shown, 9 images]
[im 5/38  brain]
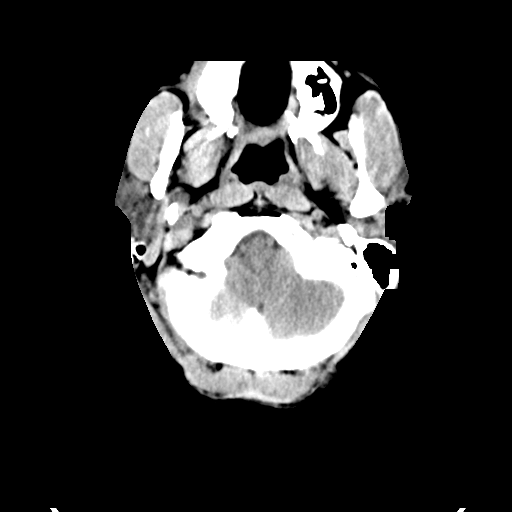
[im 5/38  bone]
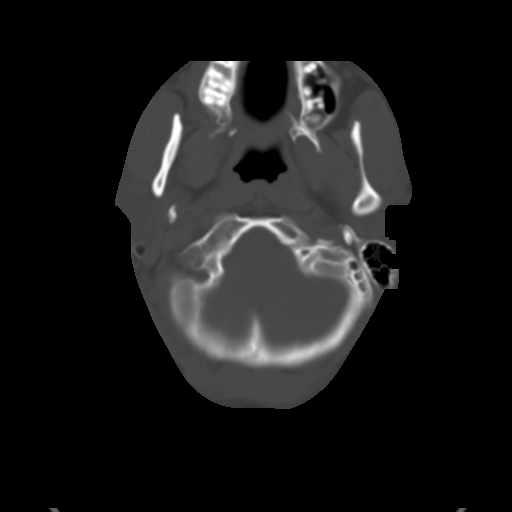
[im 10/38  brain]
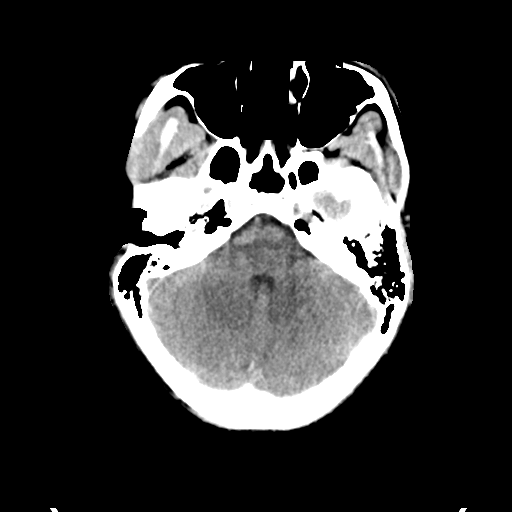
[im 14/38  brain]
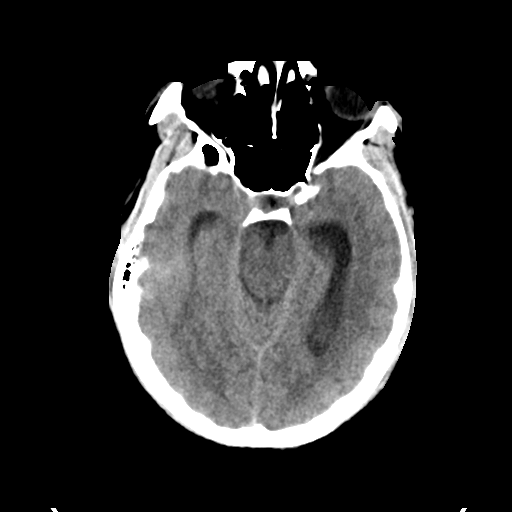
[im 19/38  brain]
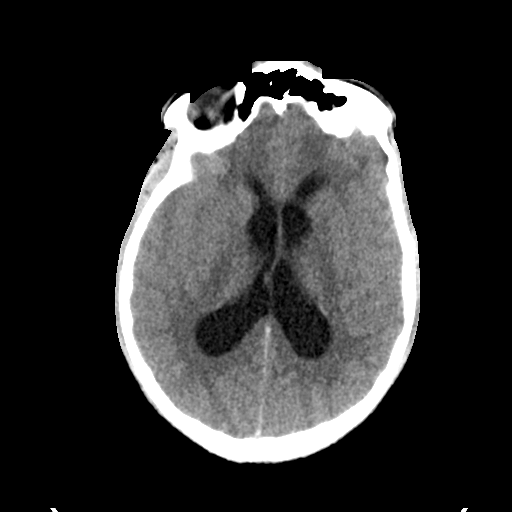
[im 24/38  brain]
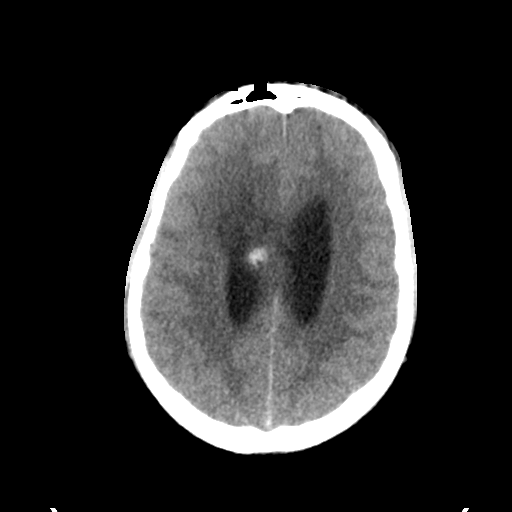
[im 24/38  bone]
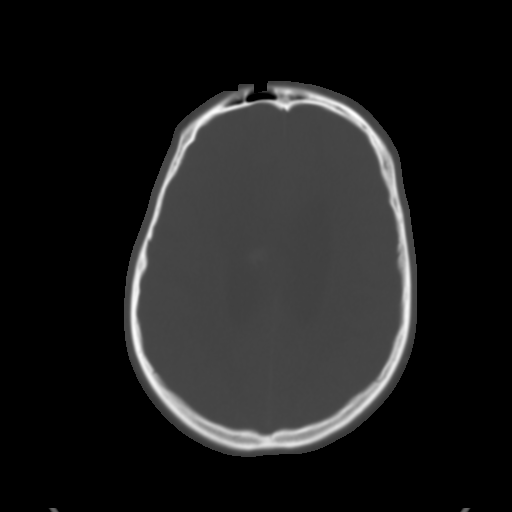
[im 28/38  brain]
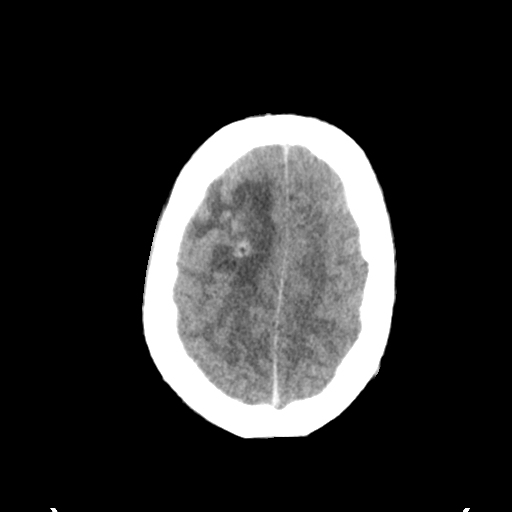
[im 33/38  brain]
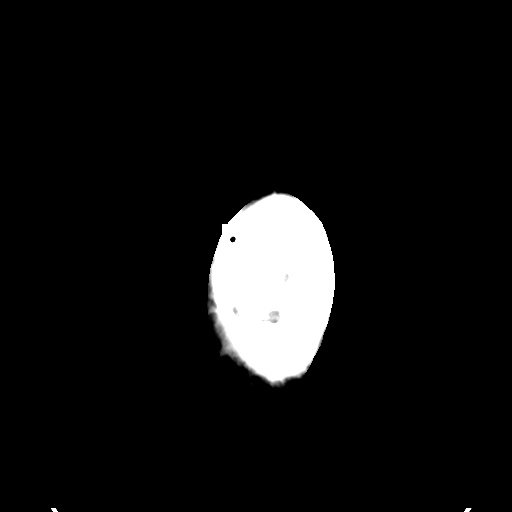

[Series 5: head without cor · coronal · non-contrast · 0.35mm/px · 3 of 76 slices shown]
[im 26/76  brain]
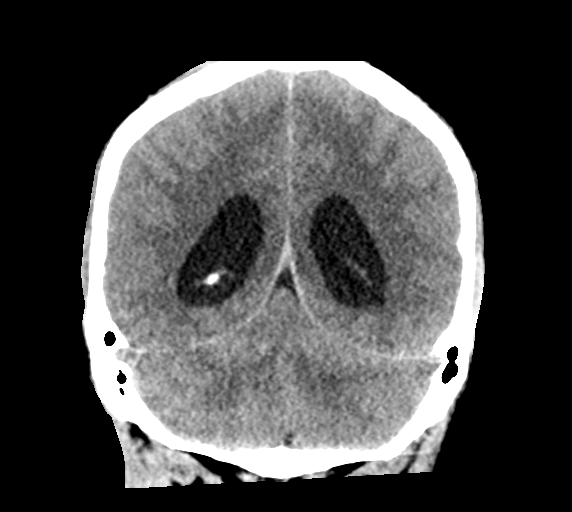
[im 34/76  brain]
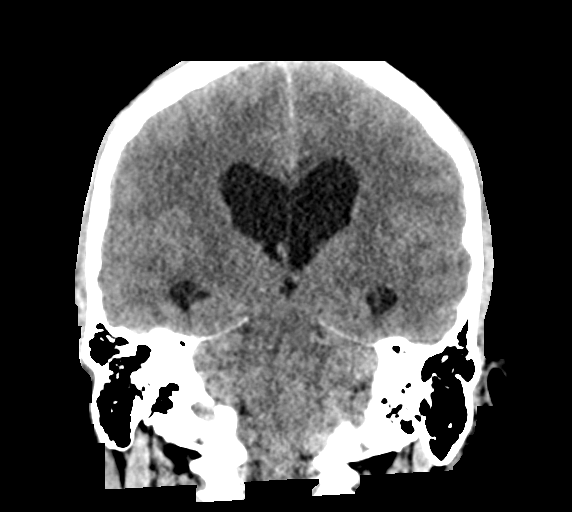
[im 42/76  brain]
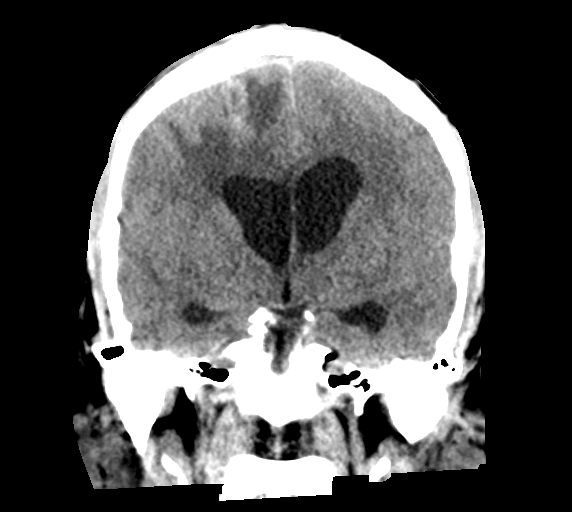

[Series 6: head without sag · sagittal · non-contrast · 0.36mm/px · 3 of 67 slices shown]
[im 23/67  brain]
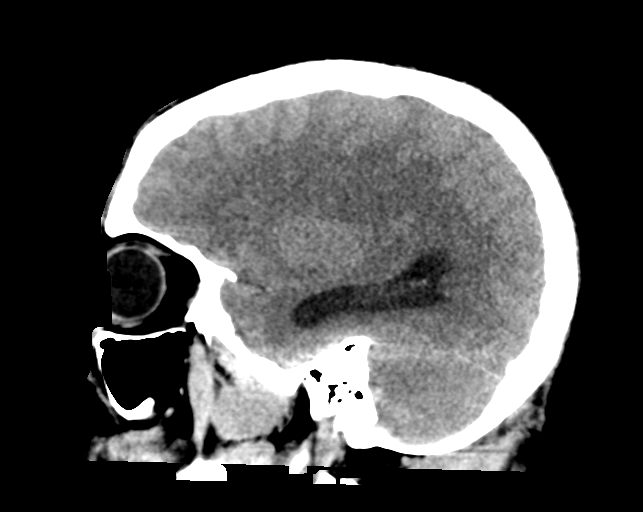
[im 34/67  brain]
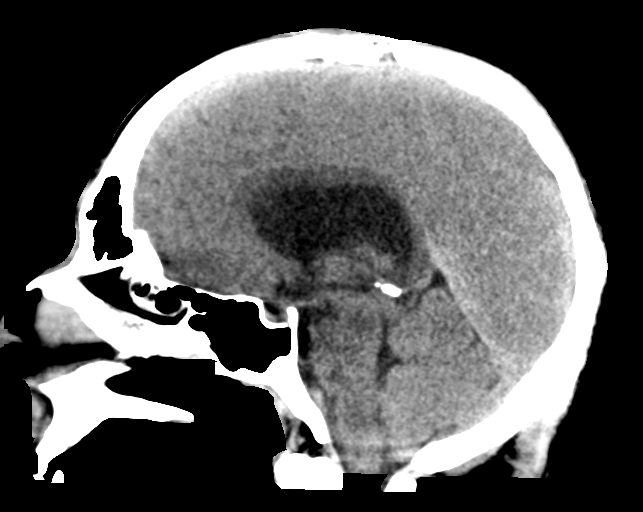
[im 45/67  brain]
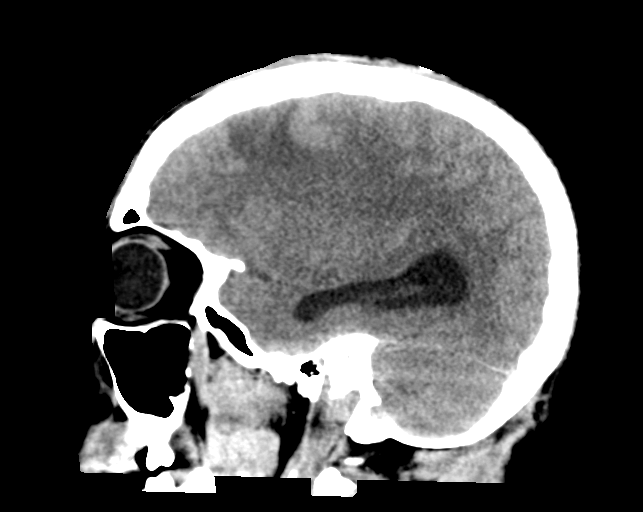

[16 of 47 positions shown; findings below may reference images not displayed]

FINDINGS: Brain:

Similar appearance of hemorrhage along the right frontal approach
surgical tract with surrounding edema. A small amount of
subarachnoid hemorrhage is now questioned within the prepontine
cistern, which may be secondary to redistribution. Mild-to-moderate
lateral ventriculomegaly has increased since prior examination
10/07/2019. No midline shift.

Vascular: No hyperdense vessel.

Skull: Right frontal vertex cranioplasty.  Right frontal burr hole.

Sinuses/Orbits: Visualized orbits demonstrate no acute abnormality.
No significant paranasal sinus disease or mastoid effusion.

These results will be called to the ordering clinician or
representative by the Radiologist Assistant, and communication
documented in the PACS or zVision Dashboard.
IMPRESSION: Interval increase in lateral ventriculomegaly as compared to
examination 10/07/2019.

Similar appearance of hemorrhage along the right frontal approach
surgical tract with surrounding edema.

A small amount of subarachnoid hemorrhage is now questioned within
the prepontine cistern, which may be secondary to interval
redistribution.

## 2022-06-13 ENCOUNTER — Telehealth: Payer: Self-pay | Admitting: Sports Medicine

## 2022-06-13 NOTE — Telephone Encounter (Signed)
Called patient's mother Tacey Ruiz left message to return call to schedule an appointment for patient per request with Dr. Shon Baton.

## 2022-07-01 ENCOUNTER — Encounter: Payer: Self-pay | Admitting: Sports Medicine

## 2022-07-01 ENCOUNTER — Ambulatory Visit (INDEPENDENT_AMBULATORY_CARE_PROVIDER_SITE_OTHER): Payer: Commercial Managed Care - HMO | Admitting: Sports Medicine

## 2022-07-01 VITALS — BP 147/77 | HR 77 | Ht 75.0 in | Wt 215.0 lb

## 2022-07-01 DIAGNOSIS — Z7689 Persons encountering health services in other specified circumstances: Secondary | ICD-10-CM

## 2022-07-01 DIAGNOSIS — F900 Attention-deficit hyperactivity disorder, predominantly inattentive type: Secondary | ICD-10-CM | POA: Diagnosis not present

## 2022-07-01 DIAGNOSIS — R030 Elevated blood-pressure reading, without diagnosis of hypertension: Secondary | ICD-10-CM | POA: Diagnosis not present

## 2022-07-01 DIAGNOSIS — G911 Obstructive hydrocephalus: Secondary | ICD-10-CM

## 2022-07-01 MED ORDER — METHYLPHENIDATE HCL ER (OSM) 36 MG PO TBCR: 36.0000 mg | EXTENDED_RELEASE_TABLET | Freq: Every morning | ORAL | 0 refills | Status: DC

## 2022-07-01 NOTE — Progress Notes (Signed)
Ward Boissonneault - 26 y.o. male MRN [id number]  Date of birth: 13-Mar-1996  Office Visit Note: Visit Date: 07/01/2022 PCP: System, Provider Not In Referred by: No ref. provider found  Subjective: CC: establish new patient; evaluate and treat ADHD  HPI: Tommy Austin is a pleasant 26 y.o. male who presents today to establish care with a new PCP as well as discuss managing his ADHD.  Tommy Austin had been a patient at [facility] family physician, however he was having some trouble with follow-up and medication refill issues.  He is currently looking for a new primary care physician.  He has a past medical history of ADHD, predominantly inattentive type that was diagnosed in childhood he was on medications for many years before he stopped for couple years in high school and following.  Back at the age of 33 he realized he needed the medication for cognitive benefit and was restarted on this.  For the last 2-3 years he has been managed on 36 mg of methylphenidate controlled release and has been doing well.  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  He had this in place for at least a month and then this was removed.  He has had some issues with cognitive slowing following this, but after 1 year this has slowly improved.  He feels like he continues to get better and better.  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  He denies any history of heart disease, lung pathology or hypertension.  Pertinent ROS were reviewed with the patient and found to be negative unless otherwise specified above in HPI.   Assessment & Plan: Visit Diagnoses:  1. ADHD, predominantly inattentive type   2. Encounter to establish care   3. Obstructive  hydrocephalus ([place])   4. Blood pressure elevated without history of HTN    Plan: Discussed establishing care with necklace today, agreeable to take him on as PCP.  We did review his current and previous past medical history as well as current medications taken.  He was currently being treated for ADHD, inattentive type as well and has been managed on Concerta 36 mg once daily.  Unfortunately he has been out of this for over a week due to medication and pharmacy difficulties.  I did check his PDMP which was reviewed and he has been adherent to appropriate medication use.  I did refill methylphenidate 36 mg extended release for him today.  His blood pressure was mildly elevated today, although slightly nervous given new PCP appointment and this could be a side effect from being off of his routine stimulant medication for over a week.  He is asymptomatic, will follow this at next visit and monitor for blood pressure control.  We will have him follow-up in 3 months for reevaluation, he will let me know if he has any issues with this going forward.  I did discuss having a verbal agreement to have random urinalysis checked given that he is on a controlled substance, patient is agreeable and understanding to that today.  Follow-up in 3 months.  Follow-up: Return in about 3 months (around 09/30/2022) for for ADHD follow-up with Dr. Rolena Infante.   Meds & Orders:  Meds ordered this encounter  Medications  methylphenidate 36 MG PO CR tablet    Sig: Take 1 tablet (36 mg total) by mouth every morning.    Dispense:  90 tablet    Refill:  0   No orders of the defined types were placed in this encounter.    Procedures: No procedures performed      Clinical History: No specialty comments available.  He reports that he has never smoked. He has never used smokeless tobacco. No results for input(s): "HGBA1C", "LABURIC" in the last 8760 hours.  Objective:   Vital Signs: BP (!) 147/77   Pulse 77   Ht 6\' 3"  (1.905  m)   Wt 215 lb (97.5 kg)   BMI 26.87 kg/m   Physical Exam  Gen: Well-appearing, in no acute distress; non-toxic CV: Regular rate and rhythm, normal S1 and S2, no murmurs gallops or rubs Resp: Breathing unlabored on room air.  Breath sounds clear in all anterior and posterior lung fields.  No wheezing rhonchi or rails. Psych: Fluid speech in conversation; appropriate affect; normal thought process Neuro: Sensation intact throughout. No gross coordination deficits.   Imaging: No results found.  Past Medical/Family/Surgical/Social History: Medications & Allergies reviewed per EMR, new medications updated. Patient Active Problem List   Diagnosis Date Noted   Obstructive hydrocephalus ([place]) 09/22/2019   ADHD 05/11/2017   Cystic acne 05/11/2017   MRSA cellulitis 05/11/2017   Past Medical History:  Diagnosis Date   ADHD    Cystic acne    Family History  Problem Relation Age of Onset   Acne Mother    Past Surgical History:  Procedure Laterality Date   APPLICATION OF CRANIAL NAVIGATION Right 09/23/2019   Procedure: APPLICATION OF CRANIAL NAVIGATION;  Surgeon: Judith Part, MD;  Location: [place];  Service: Neurosurgery;  Laterality: Right;   CRANIOTOMY Right 09/23/2019   Procedure: CRANIOTOMY TUMOR EXCISION;  Surgeon: Judith Part, MD;  Location: [place];  Service: Neurosurgery;  Laterality: Right;   HERNIA REPAIR     Social History   Occupational History   Not on file  Tobacco Use   Smoking status: Never   Smokeless tobacco: Never  Vaping Use   Vaping Use: Never used  Substance and Sexual Activity   Alcohol use: No   Drug use: No   Sexual activity: Yes    Partners: Female

## 2022-07-01 NOTE — Progress Notes (Signed)
Wanting to switch PCP  Needs prescription today refilled, methylphenidate

## 2022-09-01 ENCOUNTER — Telehealth: Payer: Self-pay | Admitting: Sports Medicine

## 2022-09-01 ENCOUNTER — Encounter: Payer: Self-pay | Admitting: Sports Medicine

## 2022-09-01 ENCOUNTER — Ambulatory Visit: Payer: Commercial Managed Care - HMO | Admitting: Sports Medicine

## 2022-09-01 DIAGNOSIS — F900 Attention-deficit hyperactivity disorder, predominantly inattentive type: Secondary | ICD-10-CM | POA: Diagnosis not present

## 2022-09-01 MED ORDER — METHYLPHENIDATE HCL ER 36 MG PO TB24: 36.0000 mg | ORAL_TABLET | Freq: Every day | ORAL | 0 refills | Status: DC

## 2022-09-01 MED ORDER — METHYLPHENIDATE HCL ER (OSM) 36 MG PO TBCR: 36.0000 mg | EXTENDED_RELEASE_TABLET | Freq: Every morning | ORAL | 0 refills | Status: DC

## 2022-09-01 NOTE — Progress Notes (Signed)
Here for medication refill He states he is completely out of the prescription

## 2022-09-01 NOTE — Progress Notes (Signed)
Tommy Austin - 26 y.o. male MRN [id number]  Date of birth: 11-29-1995  Office Visit Note: Visit Date: 09/01/2022 PCP: System, Provider Not In Referred by: No ref. provider found  Subjective: No chief complaint on file.  HPI: Tommy Austin is a pleasant 26 y.o. male who presents today for follow-up of ADHD.   First seen and evaluated by myself on 07/01/2022.  Did refill extended release methylphenidate 36 mg to be taken once daily.  I wrote for 90 tablets, however given his insurance they will only do 30 tablets at a time.  He has been doing stable on this dose.  His like his concentration is improved.  Not reporting any insomnia, palpitations or other side effects.  Feels stable on the medication.  Pertinent ROS were reviewed with the patient and found to be negative unless otherwise specified above in HPI.   Assessment & Plan: Visit Diagnoses:  1. ADHD, predominantly inattentive type    Plan: We will continue on methylphenidate extended release 36 mg once daily, I did refill this medication today.  We did call the pharmacy, they are only able to get about 30 days at a time given his insurance.  Refills are not allowed to given this is a controlled substance.  Will write for 3 separate 1 month supplies with starting and date appropriate.  He will call at the end of the 78-month mark a week before his medication runs out to discuss refill.  I would like him to follow-up with me in 6 months to see how he is doing.  May call or return sooner if any issues arise.  Follow-up: Return in about 6 months (around 03/02/2023).   Meds & Orders:  Meds ordered this encounter  Medications   methylphenidate 36 MG PO CR tablet    Sig: Take 1 tablet (36 mg total) by mouth every morning.    Dispense:  30 tablet    Refill:  0   methylphenidate 36 MG PO CR tablet    Sig: Take 1 tablet (36 mg total) by mouth daily.    Dispense:  30 tablet    Refill:  0    Please dispense generic form for least  inexpensive.   methylphenidate 36 MG PO CR tablet    Sig: Take 1 tablet (36 mg total) by mouth daily.    Dispense:  30 tablet    Refill:  0    Please dispense generic form, most inexpensive   No orders of the defined types were placed in this encounter.    Procedures: No procedures performed      Clinical History: No specialty comments available.  He reports that he has never smoked. He has never used smokeless tobacco. No results for input(s): "HGBA1C", "LABURIC" in the last 8760 hours.  Objective:   Vital Signs: There were no vitals taken for this visit.  Physical Exam  Gen: Well-appearing, in no acute distress; non-toxic CV: Regular Rate. Well-perfused. Warm.  Normal S1 and S2, no murmurs gallops or rubs. Resp: Breathing unlabored on room air; no wheezing.  Full breath sounds in anterior and posterior lung fields, no wheezing rhonchi or rales. Psych: Fluid speech in conversation; appropriate affect; normal thought process Neuro: Sensation intact throughout. No gross coordination deficits.   Imaging: No results found.  Past Medical/Family/Surgical/Social History: Medications & Allergies reviewed per EMR, new medications updated. Patient Active Problem List   Diagnosis Date Noted   Obstructive hydrocephalus ([facility]) 09/22/2019   ADHD 05/11/2017  Cystic acne 05/11/2017   MRSA cellulitis 05/11/2017   Past Medical History:  Diagnosis Date   ADHD    Cystic acne    Family History  Problem Relation Age of Onset   Acne Mother    Past Surgical History:  Procedure Laterality Date   APPLICATION OF CRANIAL NAVIGATION Right 09/23/2019   Procedure: APPLICATION OF CRANIAL NAVIGATION;  Surgeon: Jadene Pierini, MD;  [facility];  Service: Neurosurgery;  Laterality: Right;   CRANIOTOMY Right 09/23/2019   Procedure: CRANIOTOMY TUMOR EXCISION;  Surgeon: Jadene Pierini, MD;  Location: [facility];  Service: Neurosurgery;  Laterality: Right;   HERNIA REPAIR     Social  History   Occupational History   Not on file  Tobacco Use   Smoking status: Never   Smokeless tobacco: Never  Vaping Use   Vaping Use: Never used  Substance and Sexual Activity   Alcohol use: No   Drug use: No   Sexual activity: Yes    Partners: Female

## 2022-09-01 NOTE — Telephone Encounter (Signed)
Patient called in letting him know he forgot to schedule a follow up so he could get his medication refilled but he did just schedule an appt today and ws wondering if he could still get a refill on  methylphenidate

## 2022-11-27 ENCOUNTER — Other Ambulatory Visit: Payer: Self-pay | Admitting: Sports Medicine

## 2022-11-27 DIAGNOSIS — F900 Attention-deficit hyperactivity disorder, predominantly inattentive type: Secondary | ICD-10-CM

## 2022-11-28 ENCOUNTER — Other Ambulatory Visit: Payer: Self-pay | Admitting: Sports Medicine

## 2022-11-28 DIAGNOSIS — F900 Attention-deficit hyperactivity disorder, predominantly inattentive type: Secondary | ICD-10-CM

## 2022-11-28 MED ORDER — METHYLPHENIDATE HCL ER 36 MG PO TB24: 36.0000 mg | ORAL_TABLET | Freq: Every day | ORAL | 0 refills | Status: DC

## 2022-11-28 MED ORDER — METHYLPHENIDATE HCL ER (OSM) 36 MG PO TBCR: 36.0000 mg | EXTENDED_RELEASE_TABLET | Freq: Every morning | ORAL | 0 refills | Status: DC

## 2022-11-28 NOTE — Progress Notes (Signed)
Re-filled Methylphenidate prescriptions. PDMP reviewed, appropriate.  After 3 months, he will need a follow-up appointment before provided new prescription.  Elba Barman, DO Primary Care Sports Medicine Physician  [place]

## 2023-02-01 ENCOUNTER — Other Ambulatory Visit: Payer: Self-pay | Admitting: Sports Medicine

## 2023-02-01 DIAGNOSIS — F900 Attention-deficit hyperactivity disorder, predominantly inattentive type: Secondary | ICD-10-CM

## 2023-02-03 ENCOUNTER — Telehealth: Payer: Self-pay

## 2023-02-03 ENCOUNTER — Encounter: Payer: Self-pay | Admitting: Sports Medicine

## 2023-02-03 NOTE — Telephone Encounter (Signed)
Patient asking if we can call in his methylphenidate to [address] [facility], [facility]'t [facility] in [facility] at the moment

## 2023-02-04 ENCOUNTER — Telehealth: Payer: Self-pay | Admitting: Sports Medicine

## 2023-02-04 ENCOUNTER — Other Ambulatory Visit: Payer: Self-pay | Admitting: Sports Medicine

## 2023-02-04 DIAGNOSIS — F900 Attention-deficit hyperactivity disorder, predominantly inattentive type: Secondary | ICD-10-CM

## 2023-02-04 MED ORDER — METHYLPHENIDATE HCL ER (OSM) 36 MG PO TBCR: 36.0000 mg | EXTENDED_RELEASE_TABLET | Freq: Every morning | ORAL | 0 refills | Status: DC

## 2023-02-04 NOTE — Telephone Encounter (Signed)
Pty came in office stating that all the [facility] is out of [facility] of methylphen[facility]ate. He is asking if Lequita Halt can call [facility] on [address] is in stock and if so sen their. Please call pt when medication has been sent to pharmacy in stock. Pt phone number is [phone].

## 2023-02-05 NOTE — Telephone Encounter (Signed)
Taken care of by my partner, Denny Peon. Will refill at next appropriate after office visit.   DB

## 2023-02-26 ENCOUNTER — Ambulatory Visit: Payer: Commercial Managed Care - HMO | Admitting: Sports Medicine

## 2023-02-26 ENCOUNTER — Encounter: Payer: Self-pay | Admitting: Sports Medicine

## 2023-02-26 VITALS — BP 119/71 | HR 46

## 2023-02-26 DIAGNOSIS — L731 Pseudofolliculitis barbae: Secondary | ICD-10-CM

## 2023-02-26 DIAGNOSIS — F900 Attention-deficit hyperactivity disorder, predominantly inattentive type: Secondary | ICD-10-CM

## 2023-02-26 DIAGNOSIS — L700 Acne vulgaris: Secondary | ICD-10-CM | POA: Diagnosis not present

## 2023-02-26 NOTE — Progress Notes (Addendum)
Tommy Austin - 27 y.o. male MRN [id number]  Date of birth: 1996-05-26  Office Visit Note: Visit Date: 02/26/2023 PCP: System, Provider Not In Referred by: No ref. provider found  Subjective: No chief complaint on file.  HPI: Tommy Austin is a pleasant 27 y.o. male who presents today for ADHD management.  Tommy Austin is doing well. He is managed on extended release methylphenidate 36 mg once daily.  He has been on this chronically this is a very effective dose for him.  He not having any issues with insomnia, palpitations, his appetite is good.  Feels safe on this medication.  He takes this mostly every day, occasionally will take it off going on a weekend.  Also having some issues with some scarring and skin thickening near the left jaw, submandibular region.  Present for a year or so.  Does come and go when he shaves but having pain and redness for few days that would then go away but his puckering/thickness of skin still present. Asking for dermatology referral.   Pertinent ROS were reviewed with the patient and found to be negative unless otherwise specified above in HPI.   Assessment & Plan: Visit Diagnoses:  1. ADHD, predominantly inattentive type   2. Pseudofolliculitis barbae   3. Acne vulgaris    Plan: Discussed with Janyth Pupa maintenance for his ADHD, he is doing well on his extended release methylphenidate (Concerta).  We will keep him on this dose.  There has been some issues with medication shortages at certain pharmacies, he will let me know which pharmacy he would like me to send his medication and I will send in 3 months worth.  Per our agreement for his ADHD, he will undergo infrequent UDS screenings, did place this order and he will drop off a urine sample at [facility] sometime this week for stimulant medication drug management. He is not having any side effects and is tolerating well.  He also has some skin manifestations which seem very indicative of pseudofolliculitis  barbae that I think are more chronic scarring with some skin induration as well.  Discussed trialing a topical corticosteroid, this can also be treated with oral or topical antibiotics as well but given his chronicity he is wanting to see dermatology, referral sent today to Dr. Doreen Beam at [facility] dermatology.  He will follow-up with me in 6 months.  Follow-up: Return in about 6 months (around 08/29/2023).   Meds & Orders:  Meds ordered this encounter  Medications   methylphenidate 36 MG PO CR tablet    Sig: Take 1 tablet (36 mg total) by mouth every morning.    Dispense:  30 tablet    Refill:  0    Do not fill prior to start date.   methylphenidate 36 MG PO CR tablet    Sig: Take 1 tablet (36 mg total) by mouth every morning.    Dispense:  30 tablet    Refill:  0    Do not fill prior to start date.   methylphenidate 36 MG PO CR tablet    Sig: Take 1 tablet (36 mg total) by mouth every morning.    Dispense:  30 tablet    Refill:  0    Do not fill prior to start date.    Orders Placed This Encounter  Procedures   DRUG MONITOR, PANEL 5, SCREEN, URINE   Ambulatory referral to Dermatology     Procedures: No procedures performed      Clinical History: No  specialty comments available.  He reports that he has never smoked. He has never used smokeless tobacco. No results for input(s): "HGBA1C", "LABURIC" in the last 8760 hours.  Objective:   Vital Signs: BP 119/71   Pulse (!) 46   Physical Exam  Gen: Well-appearing, in no acute distress; non-toxic CV: Bradycardic Rate, but regular rhythm. Well-perfused. Warm.  Resp: Breathing unlabored on room air; no wheezing. Psych: Fluid speech in conversation; appropriate affect; normal thought process Neuro: Sensation intact throughout. No gross coordination deficits.  Neck: There is evidence of pseudofolliculitis [place] that now has scar tissue that is firm with some induration.  There is mild redness with a small pustule in the  center although no or surrounding loculation.  There is some evidence of scattered acne vulgaris as well.  Imaging: No results found.  Past Medical/Family/Surgical/Social History: Medications & Allergies reviewed per EMR, new medications updated. Patient Active Problem List   Diagnosis Date Noted   Obstructive hydrocephalus ([facility]) 09/22/2019   ADHD 05/11/2017   Cystic acne 05/11/2017   MRSA cellulitis 05/11/2017   Past Medical History:  Diagnosis Date   ADHD    Cystic acne    Family History  Problem Relation Age of Onset   Acne Mother    Past Surgical History:  Procedure Laterality Date   APPLICATION OF CRANIAL NAVIGATION Right 09/23/2019   Procedure: APPLICATION OF CRANIAL NAVIGATION;  Surgeon: Jadene Pierini, MD;  Location: [facility];  Service: Neurosurgery;  Laterality: Right;   CRANIOTOMY Right 09/23/2019   Procedure: CRANIOTOMY TUMOR EXCISION;  Surgeon: Jadene Pierini, MD;  Location: [facility];  Service: Neurosurgery;  Laterality: Right;   HERNIA REPAIR     Social History   Occupational History   Not on file  Tobacco Use   Smoking status: Never   Smokeless tobacco: Never  Vaping Use   Vaping Use: Never used  Substance and Sexual Activity   Alcohol use: No   Drug use: No   Sexual activity: Yes    Partners: Female   Addendum 5/24 (5:20pm): Patient did send in his pharmacy, will send 25-month supply of extended release methylphenidate to [facility] pharmacy.  Prescription sent today after PDMP review.

## 2023-02-27 ENCOUNTER — Other Ambulatory Visit: Payer: Self-pay | Admitting: Sports Medicine

## 2023-02-27 MED ORDER — METHYLPHENIDATE HCL ER (OSM) 36 MG PO TBCR: 36.0000 mg | EXTENDED_RELEASE_TABLET | Freq: Every morning | ORAL | 0 refills | Status: DC

## 2023-02-27 NOTE — Addendum Note (Signed)
Addended by: Jamie Kato III on: 02/27/2023 05:25 PM   Modules accepted: Orders

## 2023-03-30 ENCOUNTER — Encounter: Payer: Self-pay | Admitting: Sports Medicine

## 2023-04-01 ENCOUNTER — Other Ambulatory Visit: Payer: Self-pay | Admitting: Sports Medicine

## 2023-04-01 DIAGNOSIS — F900 Attention-deficit hyperactivity disorder, predominantly inattentive type: Secondary | ICD-10-CM

## 2023-04-02 LAB — DRUG MONITOR, PANEL 5, SCREEN, URINE
Amphetamines: NEGATIVE ng/mL (ref <500)
Barbiturates: NEGATIVE ng/mL (ref <300)
Benzodiazepines: NEGATIVE ng/mL (ref <100)
Cocaine Metabolite: NEGATIVE ng/mL (ref <150)
Creatinine: 60.8 mg/dL (ref >=20.0)
Marijuana Metabolite: NEGATIVE ng/mL (ref <20)
Methadone Metabolite: NEGATIVE ng/mL (ref <100)
Opiates: NEGATIVE ng/mL (ref <100)
Oxidant: NEGATIVE ug/mL (ref <200)
Oxycodone: NEGATIVE ng/mL (ref <100)
pH: 7.6 (ref 4.5–9.0)

## 2023-04-02 LAB — DM TEMPLATE

## 2023-04-05 ENCOUNTER — Encounter: Payer: Self-pay | Admitting: Sports Medicine

## 2023-04-06 ENCOUNTER — Other Ambulatory Visit: Payer: Self-pay | Admitting: Physical Medicine and Rehabilitation

## 2023-04-06 DIAGNOSIS — F900 Attention-deficit hyperactivity disorder, predominantly inattentive type: Secondary | ICD-10-CM

## 2023-04-06 MED ORDER — METHYLPHENIDATE HCL ER (OSM) 36 MG PO TBCR: 36.0000 mg | EXTENDED_RELEASE_TABLET | Freq: Every morning | ORAL | 0 refills | Status: DC

## 2023-06-04 ENCOUNTER — Telehealth: Payer: Self-pay | Admitting: Sports Medicine

## 2023-06-04 ENCOUNTER — Other Ambulatory Visit: Payer: Self-pay | Admitting: Sports Medicine

## 2023-06-04 DIAGNOSIS — F900 Attention-deficit hyperactivity disorder, predominantly inattentive type: Secondary | ICD-10-CM

## 2023-06-04 MED ORDER — METHYLPHENIDATE HCL ER (OSM) 36 MG PO TBCR: 36.0000 mg | EXTENDED_RELEASE_TABLET | Freq: Every morning | ORAL | 0 refills | Status: DC

## 2023-06-04 NOTE — Telephone Encounter (Signed)
Patient called needing Rx refilled Methylphenidate 36 mg. The number to contact patient is [phone]

## 2023-08-06 ENCOUNTER — Encounter: Payer: Self-pay | Admitting: Sports Medicine

## 2023-08-06 ENCOUNTER — Other Ambulatory Visit: Payer: Self-pay | Admitting: Sports Medicine

## 2023-08-06 DIAGNOSIS — F900 Attention-deficit hyperactivity disorder, predominantly inattentive type: Secondary | ICD-10-CM

## 2023-08-06 MED ORDER — METHYLPHENIDATE HCL ER (OSM) 36 MG PO TBCR: 36.0000 mg | EXTENDED_RELEASE_TABLET | Freq: Every morning | ORAL | 0 refills | Status: DC

## 2023-09-28 ENCOUNTER — Ambulatory Visit: Payer: Commercial Managed Care - HMO | Admitting: Sports Medicine

## 2023-09-28 ENCOUNTER — Encounter: Payer: Self-pay | Admitting: Sports Medicine

## 2023-09-28 VITALS — BP 135/86 | HR 86 | Ht 75.0 in | Wt 200.0 lb

## 2023-09-28 DIAGNOSIS — G5621 Lesion of ulnar nerve, right upper limb: Secondary | ICD-10-CM

## 2023-09-28 DIAGNOSIS — F900 Attention-deficit hyperactivity disorder, predominantly inattentive type: Secondary | ICD-10-CM

## 2023-09-28 MED ORDER — METHYLPHENIDATE HCL ER (OSM) 36 MG PO TBCR: 36.0000 mg | EXTENDED_RELEASE_TABLET | Freq: Every morning | ORAL | 0 refills | Status: DC

## 2023-09-28 NOTE — Progress Notes (Signed)
Tommy Austin - 27 y.o. male MRN [id number]  Date of birth: 1996/01/11  Office Visit Note: Visit Date: 09/28/2023 PCP: System, Provider Not In Referred by: No ref. provider found  Subjective: Chief Complaint  Patient presents with   OTHER   HPI: Tommy Austin is a pleasant 27 y.o. male who presents today for for ADHD management.   [name] reports that he is doing well. He is managed on extended release methylphenidate 36 mg once daily.  He has been on this chronically this is a very effective dose for him. He is not having any issues with insomnia, palpitations, his appetite is good.  Feels safe on this medication.  He takes this essentially every day, has maybe one holiday day once per month.  Also having some N/T in 4th and 5th digit on right hand x 2 weeks.  He had been having some right shoulder pain as he was being active in the gym, this has resolved since he has pulled back but still having some residual numbness and tingling.  No weakness, not bothering him greatly.  Does note that he works with his elbows bent for many hours of the day.  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  Pertinent ROS were reviewed with the patient and found to be negative unless otherwise specified above in HPI.   Assessment & Plan: Visit Diagnoses:  1. ADHD, predominantly inattentive type   2. Ulnar neuritis, right    Plan: [name] is doing well with his ADHD and has been stable on his extended release methylphenidate (Concerta) 36mg  every day.  This dose has been effective and he is not having side effect profile associated with this.  We did refill this for 6-month intervals.  Per our agreement for his ADHD, he will undergo infrequent UDS screenings, we will hold on this for now but will consider this sometime over the next 6 months at his follow-up.  In terms of his right hand numbness and tingling in the fourth and fifth digit, this seems activity associated with  a degree of ulnar neuritis.  This is likely from his prolonged elbow flexion, discussed activity modification.  Also treating with sleeping with his elbow straight within the pillowcase.  Okay for over-the-counter anti-inflammatories as needed.  Discussed if this does not abate with above conservative treatment over the next month, he may make an appointment for this and we will start with imaging and further diagnostic workup.  He will follow-up with me in 6 months otherwise.  Follow-up: Return in about 6 months (around 03/28/2024) for for ADHD f/u .   Meds & Orders: No orders of the defined types were placed in this encounter.  No orders of the defined types were placed in this encounter.    Procedures: No procedures performed      Clinical History: No specialty comments available.  He reports that he has never smoked. He has never used smokeless tobacco. No results for input(s): "HGBA1C", "LABURIC" in the last 8760 hours.  Today's Vitals   09/28/23 0918 09/28/23 0919  BP: 137/78 135/86  Pulse: 81 86  Weight: 200 lb (90.7 kg)   Height: 6\' 3"  (1.905 m)    Body mass index is 25 kg/m.  Objective:    Physical Exam  Gen: Well-appearing, in no acute distress; non-toxic CV: Regular Rate. Well-perfused. Warm.  Resp: Breathing unlabored on room air; no wheezing. Psych: Fluid speech in conversation; appropriate affect; normal thought process Neuro:  Sensation intact throughout. No gross coordination deficits.   Ortho Exam - Right elbow/wrist: + Tinel's over the cubital tunnel at the elbow, negative at Guyon's canal at the wrist.  There is no redness or swelling.  There is full range of motion of all 5 fingers.  Preserved grip strength.  Cap refill less than 2 seconds.  Imaging: No results found.  Past Medical/Family/Surgical/Social History: Medications & Allergies reviewed per EMR, new medications updated. Patient Active Problem List   Diagnosis Date Noted   Obstructive  hydrocephalus ([facility]) 09/22/2019   ADHD 05/11/2017   Cystic acne 05/11/2017   MRSA cellulitis 05/11/2017   Past Medical History:  Diagnosis Date   ADHD    Cystic acne    Family History  Problem Relation Age of Onset   Acne Mother    Past Surgical History:  Procedure Laterality Date   APPLICATION OF CRANIAL NAVIGATION Right 09/23/2019   Procedure: APPLICATION OF CRANIAL NAVIGATION;  Surgeon: Jadene Pierini, MD;  Location: [facility];  Service: Neurosurgery;  Laterality: Right;   CRANIOTOMY Right 09/23/2019   Procedure: CRANIOTOMY TUMOR EXCISION;  Surgeon: Jadene Pierini, MD;  Location: [facility];  Service: Neurosurgery;  Laterality: Right;   HERNIA REPAIR     Social History   Occupational History   Not on file  Tobacco Use   Smoking status: Never   Smokeless tobacco: Never  Vaping Use   Vaping status: Never Used  Substance and Sexual Activity   Alcohol use: No   Drug use: No   Sexual activity: Yes    Partners: Female

## 2023-09-28 NOTE — Progress Notes (Signed)
Patient says that he has been doing well. No new concerns.  He does mention that over the last couple of weeks he has had numbness in the 4th and 5th fingers of the right hand. He says he does not need that checked today, but brings it up in case it is a concern to be addressed. He says he does have a massage scheduled.

## 2023-10-22 ENCOUNTER — Ambulatory Visit: Payer: Commercial Managed Care - HMO | Admitting: Dermatology

## 2024-02-06 ENCOUNTER — Other Ambulatory Visit: Payer: Self-pay | Admitting: Sports Medicine

## 2024-02-06 DIAGNOSIS — F900 Attention-deficit hyperactivity disorder, predominantly inattentive type: Secondary | ICD-10-CM

## 2024-02-09 ENCOUNTER — Encounter: Payer: Self-pay | Admitting: Sports Medicine

## 2024-02-09 ENCOUNTER — Other Ambulatory Visit: Payer: Self-pay | Admitting: Sports Medicine

## 2024-02-09 DIAGNOSIS — F900 Attention-deficit hyperactivity disorder, predominantly inattentive type: Secondary | ICD-10-CM

## 2024-02-09 MED ORDER — METHYLPHENIDATE HCL ER (OSM) 36 MG PO TBCR: 36.0000 mg | EXTENDED_RELEASE_TABLET | Freq: Every morning | ORAL | 0 refills | Status: DC

## 2024-02-09 NOTE — Progress Notes (Signed)
 Did refill Concerta  prescription (2) as appropriate.  Will need appt near end of June before additional refills in July. Will send message to patient.  Shauna Del, DO Primary Care Sports Medicine Physician  [facility]  This note was dictated using Dragon naturally speaking software and may contain errors in syntax, spelling, or content which have not been identified prior to signing this note.

## 2024-02-09 NOTE — Telephone Encounter (Signed)
 Message sent to patient

## 2024-03-31 ENCOUNTER — Ambulatory Visit: Admitting: Sports Medicine

## 2024-03-31 ENCOUNTER — Encounter: Payer: Self-pay | Admitting: Sports Medicine

## 2024-03-31 VITALS — BP 131/73 | HR 80

## 2024-03-31 DIAGNOSIS — F900 Attention-deficit hyperactivity disorder, predominantly inattentive type: Secondary | ICD-10-CM

## 2024-03-31 MED ORDER — METHYLPHENIDATE HCL ER (OSM) 36 MG PO TBCR: 36.0000 mg | EXTENDED_RELEASE_TABLET | Freq: Every morning | ORAL | 0 refills | Status: DC

## 2024-03-31 NOTE — Progress Notes (Signed)
 Mervin Ramires - 28 y.o. male MRN [id number]  Date of birth: 1996/04/13  Office Visit Note: Visit Date: 03/31/2024 PCP: System, Provider Not In Referred by: No ref. provider found  Subjective: No chief complaint on file.  HPI: Rayford Williamsen is a pleasant 28 y.o. male who presents today for ADHD f/u, primarily inattentive-type.  [name] continues to do well and is tolerating ER-Methylphenidate  36mg  once daily.  He continues to find this dose effective.  He has not had any issues with sleep or appetite suppression.  No heart palpitations.  He takes this medication once daily, maybe has a holiday about once per month but this is not typical.  He continues to be active and is lifting weights and running about 1 mile usually 5 times per week.  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 He has no other concerns.  Pertinent ROS were reviewed with the patient and found to be negative unless otherwise specified above in HPI.   Assessment & Plan: Visit Diagnoses:  1. ADHD, predominantly inattentive type    Plan: [name] continues doing well in terms of his ADHD and is quite stable on his extended release methylphenidate  (Concerta ) 36mg  every day.  He will continue this medication and we will refill this for him at 35-month intervals.  Per our agreement for his ADHD, he will follow-up at 31-month intervals to make sure he is tolerating this and not having side effects.  This could include infrequent UDS screenings to ensure tolerability and no other drug interations.  Did commend him on his physical activity and workout regimen, he will continue this for his overall health.  He will follow-up with me in 6 months, may call or return sooner only if any issues arise.  Follow-up: Return in about 6 months (around 09/30/2024) for ADHD.   Meds & Orders:  Meds ordered this encounter  Medications   methylphenidate  36 MG PO CR tablet    Sig: Take 1 tablet (36 mg total) by mouth every morning.    Dispense:   30 tablet    Refill:  0    Do not fill prior to start date.   methylphenidate  36 MG PO CR tablet    Sig: Take 1 tablet (36 mg total) by mouth every morning.    Dispense:  30 tablet    Refill:  0    Do not fill prior to start date.   methylphenidate  36 MG PO CR tablet    Sig: Take 1 tablet (36 mg total) by mouth every morning.    Dispense:  30 tablet    Refill:  0    Do not fill prior to start date.   No orders of the defined types were placed in this encounter.    Procedures: No procedures performed      Clinical History: No specialty comments available.  He reports that he has never smoked. He has never used smokeless tobacco. No results for input(s): HGBA1C, LABURIC in the last 8760 hours.  Objective:   Vital Signs: BP 131/73 (BP Location: Right Arm, Patient Position: Sitting)   Pulse 80   Physical Exam  Gen: Well-appearing, in no acute distress; non-toxic CV: Well-perfused. Warm.  Normal S1, S2.  No murmurs gallops or rubs. Resp: Breathing unlabored on room air.  Full breath sounds in both anterior and posterior lung fields, no wheezing, rhonchi, or rales Psych: Fluid speech in conversation; appropriate affect; normal thought process  Imaging: No results found.  Past Medical/Family/Surgical/Social History: Medications &  Allergies reviewed per EMR, new medications updated. Patient Active Problem List   Diagnosis Date Noted   Obstructive hydrocephalus (HCC) 09/22/2019   ADHD 05/11/2017   Cystic acne 05/11/2017   MRSA cellulitis 05/11/2017   Past Medical History:  Diagnosis Date   ADHD    Cystic acne    Family History  Problem Relation Age of Onset   Acne Mother    Past Surgical History:  Procedure Laterality Date   APPLICATION OF CRANIAL NAVIGATION Right 09/23/2019   Procedure: APPLICATION OF CRANIAL NAVIGATION;  Surgeon: Cheryle Debby LABOR, MD;  Location: [facility] OR;  Service: Neurosurgery;  Laterality: Right;   CRANIOTOMY Right 09/23/2019   Procedure:  CRANIOTOMY TUMOR EXCISION;  Surgeon: Cheryle Debby LABOR, MD;  Location: [facility] OR;  Service: Neurosurgery;  Laterality: Right;   HERNIA REPAIR     Social History   Occupational History   Not on file  Tobacco Use   Smoking status: Never   Smokeless tobacco: Never  Vaping Use   Vaping status: Never Used  Substance and Sexual Activity   Alcohol use: No   Drug use: No   Sexual activity: Yes    Partners: Female

## 2024-04-19 ENCOUNTER — Ambulatory Visit: Payer: Commercial Managed Care - HMO | Admitting: Dermatology

## 2024-06-13 ENCOUNTER — Encounter: Payer: Self-pay | Admitting: Sports Medicine

## 2024-06-13 ENCOUNTER — Other Ambulatory Visit: Payer: Self-pay | Admitting: Sports Medicine

## 2024-06-13 DIAGNOSIS — F900 Attention-deficit hyperactivity disorder, predominantly inattentive type: Secondary | ICD-10-CM

## 2024-06-13 MED ORDER — METHYLPHENIDATE HCL ER (OSM) 36 MG PO TBCR: 36.0000 mg | EXTENDED_RELEASE_TABLET | Freq: Every morning | ORAL | 0 refills | Status: DC

## 2024-06-13 NOTE — Progress Notes (Signed)
 Filled prescription for Concerta . Will need appt 1st week of December for f/u and for additional refills.  Lonell Sprang, DO Primary Care Sports Medicine Physician  [facility]

## 2024-08-08 ENCOUNTER — Encounter: Payer: Self-pay | Admitting: Radiology

## 2024-09-05 ENCOUNTER — Encounter: Payer: Self-pay | Admitting: Sports Medicine

## 2024-09-05 ENCOUNTER — Ambulatory Visit: Admitting: Sports Medicine

## 2024-09-05 VITALS — BP 137/84 | HR 92

## 2024-09-05 DIAGNOSIS — F419 Anxiety disorder, unspecified: Secondary | ICD-10-CM

## 2024-09-05 DIAGNOSIS — F900 Attention-deficit hyperactivity disorder, predominantly inattentive type: Secondary | ICD-10-CM

## 2024-09-05 MED ORDER — METHYLPHENIDATE HCL ER (OSM) 27 MG PO TBCR: 27.0000 mg | EXTENDED_RELEASE_TABLET | Freq: Every morning | ORAL | 0 refills | Status: DC

## 2024-09-05 NOTE — Progress Notes (Unsigned)
   Rodriquez Thorner - 28 y.o. male MRN [id number]  Date of birth: 11/10/1995  Office Visit Note: Visit Date: 09/05/2024 PCP: System, Provider Not In Referred by: No ref. provider found  Subjective: Chief Complaint  Patient presents with  . ADHD   HPI: Koltan Portocarrero is a pleasant 28 y.o. male who presents today for ***  Pertinent ROS were reviewed with the patient and found to be negative unless otherwise specified above in HPI.   Assessment & Plan: Visit Diagnoses: No diagnosis found.  Plan: ***  Follow-up: No follow-ups on file.   Meds & Orders: No orders of the defined types were placed in this encounter.  No orders of the defined types were placed in this encounter.    Procedures: No procedures performed      Clinical History: No specialty comments available.  He reports that he has never smoked. He has never used smokeless tobacco. No results for input(s): HGBA1C, LABURIC in the last 8760 hours.  Objective:   Vital Signs: BP 137/84 (BP Location: Left Arm, Patient Position: Sitting)   Pulse 92   Physical Exam  Gen: Well-appearing, in no acute distress; non-toxic CV: Well-perfused. Warm.  Resp: Breathing unlabored on room air; no wheezing. Psych: Fluid speech in conversation; appropriate affect; normal thought process  Ortho Exam - ***  Imaging: No results found.  Past Medical/Family/Surgical/Social History: Medications & Allergies reviewed per EMR, new medications updated. Patient Active Problem List   Diagnosis Date Noted  . Obstructive hydrocephalus (HCC) 09/22/2019  . ADHD 05/11/2017  . Cystic acne 05/11/2017  . MRSA cellulitis 05/11/2017   Past Medical History:  Diagnosis Date  . ADHD   . Cystic acne    Family History  Problem Relation Age of Onset  . Acne Mother    Past Surgical History:  Procedure Laterality Date  . APPLICATION OF CRANIAL NAVIGATION Right 09/23/2019   Procedure: APPLICATION OF CRANIAL NAVIGATION;  Surgeon:  Cheryle Debby LABOR, MD;  Location: [facility] OR;  Service: Neurosurgery;  Laterality: Right;  . CRANIOTOMY Right 09/23/2019   Procedure: CRANIOTOMY TUMOR EXCISION;  Surgeon: Cheryle Debby LABOR, MD;  Location: [facility] OR;  Service: Neurosurgery;  Laterality: Right;  . HERNIA REPAIR     Social History   Occupational History  . Not on file  Tobacco Use  . Smoking status: Never  . Smokeless tobacco: Never  Vaping Use  . Vaping status: Never Used  Substance and Sexual Activity  . Alcohol use: No  . Drug use: No  . Sexual activity: Yes    Partners: Female

## 2024-09-05 NOTE — Progress Notes (Unsigned)
 Patient is inquiring about a lower dose of his current medication. He says that he does not have things going on now that he did previously, and feels that his current daily dose is high for his current needs.

## 2024-09-06 ENCOUNTER — Encounter: Payer: Self-pay | Admitting: Sports Medicine

## 2024-10-18 ENCOUNTER — Telehealth: Payer: Self-pay | Admitting: Sports Medicine

## 2024-10-18 NOTE — Telephone Encounter (Signed)
 Pt called requesting a call from Dr brooks concerning his medications. Please call pt at [phone].

## 2024-10-19 ENCOUNTER — Other Ambulatory Visit: Payer: Self-pay | Admitting: Sports Medicine

## 2024-10-19 ENCOUNTER — Telehealth: Payer: Self-pay | Admitting: Sports Medicine

## 2024-10-19 DIAGNOSIS — F900 Attention-deficit hyperactivity disorder, predominantly inattentive type: Secondary | ICD-10-CM

## 2024-10-19 MED ORDER — METHYLPHENIDATE HCL ER (OSM) 27 MG PO TBCR: 27.0000 mg | EXTENDED_RELEASE_TABLET | Freq: Every morning | ORAL | 0 refills | Status: AC

## 2024-10-19 NOTE — Telephone Encounter (Signed)
 Pt called requesting a call from brooks. Pt has some medical questions about his medication and also asking for a refill. Please call pt at [phone]
